# Patient Record
Sex: Male | Born: 1950 | Race: White | Hispanic: No | Marital: Married | Smoking: Former smoker
Health system: Southern US, Community
[De-identification: ages and names within clinical notes are randomized; demographics above are authoritative.]

## PROBLEM LIST (undated history)

## (undated) DIAGNOSIS — I714 Abdominal aortic aneurysm, without rupture, unspecified: Secondary | ICD-10-CM

## (undated) DIAGNOSIS — M1990 Unspecified osteoarthritis, unspecified site: Secondary | ICD-10-CM

## (undated) DIAGNOSIS — M2550 Pain in unspecified joint: Secondary | ICD-10-CM

## (undated) DIAGNOSIS — I10 Essential (primary) hypertension: Secondary | ICD-10-CM

## (undated) DIAGNOSIS — I480 Paroxysmal atrial fibrillation: Secondary | ICD-10-CM

## (undated) DIAGNOSIS — E785 Hyperlipidemia, unspecified: Secondary | ICD-10-CM

## (undated) HISTORY — DX: Paroxysmal atrial fibrillation: I48.0

## (undated) HISTORY — PX: OTHER SURGICAL HISTORY: SHX169

## (undated) HISTORY — DX: Abdominal aortic aneurysm, without rupture, unspecified: I71.40

## (undated) HISTORY — DX: Unspecified osteoarthritis, unspecified site: M19.90

## (undated) HISTORY — DX: Abdominal aortic aneurysm, without rupture: I71.4

## (undated) HISTORY — DX: Hyperlipidemia, unspecified: E78.5

## (undated) HISTORY — DX: Pain in unspecified joint: M25.50

---
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
## 1993-04-13 DIAGNOSIS — E785 Hyperlipidemia, unspecified: Secondary | ICD-10-CM

## 1993-04-13 HISTORY — DX: Hyperlipidemia, unspecified: E78.5

## 1998-07-03 ENCOUNTER — Encounter: Payer: Self-pay | Admitting: Cardiology

## 1998-07-03 ENCOUNTER — Inpatient Hospital Stay (HOSPITAL_COMMUNITY): Admission: EM | Admit: 1998-07-03 | Discharge: 1998-07-06 | Payer: Self-pay | Admitting: Emergency Medicine

## 2000-04-13 HISTORY — PX: OTHER SURGICAL HISTORY: SHX169

## 2001-07-27 ENCOUNTER — Encounter: Payer: Self-pay | Admitting: Orthopedic Surgery

## 2001-07-27 ENCOUNTER — Encounter: Admission: RE | Admit: 2001-07-27 | Discharge: 2001-07-27 | Payer: Self-pay | Admitting: Orthopedic Surgery

## 2001-07-28 ENCOUNTER — Ambulatory Visit (HOSPITAL_BASED_OUTPATIENT_CLINIC_OR_DEPARTMENT_OTHER): Admission: RE | Admit: 2001-07-28 | Discharge: 2001-07-28 | Payer: Self-pay | Admitting: Orthopedic Surgery

## 2008-08-21 ENCOUNTER — Encounter: Admission: RE | Admit: 2008-08-21 | Discharge: 2008-08-21 | Payer: Self-pay | Admitting: Cardiology

## 2009-08-14 ENCOUNTER — Encounter: Admission: RE | Admit: 2009-08-14 | Discharge: 2009-08-14 | Payer: Self-pay | Admitting: Cardiology

## 2010-02-11 ENCOUNTER — Ambulatory Visit: Payer: Self-pay | Admitting: Cardiology

## 2010-07-20 ENCOUNTER — Other Ambulatory Visit: Payer: Self-pay | Admitting: Cardiology

## 2010-07-20 DIAGNOSIS — E785 Hyperlipidemia, unspecified: Secondary | ICD-10-CM

## 2010-08-04 ENCOUNTER — Other Ambulatory Visit: Payer: Self-pay | Admitting: *Deleted

## 2010-08-04 DIAGNOSIS — E7800 Pure hypercholesterolemia, unspecified: Secondary | ICD-10-CM

## 2010-08-07 ENCOUNTER — Encounter: Payer: Self-pay | Admitting: Cardiology

## 2010-08-07 DIAGNOSIS — I10 Essential (primary) hypertension: Secondary | ICD-10-CM | POA: Insufficient documentation

## 2010-08-07 DIAGNOSIS — G43909 Migraine, unspecified, not intractable, without status migrainosus: Secondary | ICD-10-CM | POA: Insufficient documentation

## 2010-08-07 DIAGNOSIS — M2550 Pain in unspecified joint: Secondary | ICD-10-CM | POA: Insufficient documentation

## 2010-08-07 DIAGNOSIS — I480 Paroxysmal atrial fibrillation: Secondary | ICD-10-CM | POA: Insufficient documentation

## 2010-08-07 DIAGNOSIS — E785 Hyperlipidemia, unspecified: Secondary | ICD-10-CM | POA: Insufficient documentation

## 2010-08-07 DIAGNOSIS — I714 Abdominal aortic aneurysm, without rupture: Secondary | ICD-10-CM | POA: Insufficient documentation

## 2010-08-14 ENCOUNTER — Encounter: Payer: Self-pay | Admitting: Cardiology

## 2010-08-14 ENCOUNTER — Other Ambulatory Visit (INDEPENDENT_AMBULATORY_CARE_PROVIDER_SITE_OTHER): Payer: BC Managed Care – PPO | Admitting: *Deleted

## 2010-08-14 ENCOUNTER — Ambulatory Visit (INDEPENDENT_AMBULATORY_CARE_PROVIDER_SITE_OTHER): Payer: BC Managed Care – PPO | Admitting: Cardiology

## 2010-08-14 VITALS — BP 118/82 | HR 70 | Ht >= 80 in | Wt 250.0 lb

## 2010-08-14 DIAGNOSIS — I119 Hypertensive heart disease without heart failure: Secondary | ICD-10-CM

## 2010-08-14 DIAGNOSIS — E7800 Pure hypercholesterolemia, unspecified: Secondary | ICD-10-CM

## 2010-08-14 DIAGNOSIS — E785 Hyperlipidemia, unspecified: Secondary | ICD-10-CM

## 2010-08-14 DIAGNOSIS — Z125 Encounter for screening for malignant neoplasm of prostate: Secondary | ICD-10-CM

## 2010-08-14 DIAGNOSIS — I480 Paroxysmal atrial fibrillation: Secondary | ICD-10-CM

## 2010-08-14 DIAGNOSIS — I714 Abdominal aortic aneurysm, without rupture: Secondary | ICD-10-CM

## 2010-08-14 DIAGNOSIS — I4891 Unspecified atrial fibrillation: Secondary | ICD-10-CM

## 2010-08-14 LAB — HEPATIC FUNCTION PANEL
ALT: 36 U/L (ref 0–53)
AST: 40 U/L — ABNORMAL HIGH (ref 0–37)
Alkaline Phosphatase: 61 U/L (ref 39–117)
Bilirubin, Direct: 0.2 mg/dL (ref 0.0–0.3)
Total Bilirubin: 1 mg/dL (ref 0.3–1.2)
Total Protein: 7.3 g/dL (ref 6.0–8.3)

## 2010-08-14 LAB — LIPID PANEL
Total CHOL/HDL Ratio: 3
Triglycerides: 259 mg/dL — ABNORMAL HIGH (ref 0.0–149.0)

## 2010-08-14 LAB — LDL CHOLESTEROL, DIRECT: Direct LDL: 88.8 mg/dL

## 2010-08-14 LAB — PSA: PSA: 1.79 ng/mL (ref 0.10–4.00)

## 2010-08-14 LAB — BASIC METABOLIC PANEL
BUN: 10 mg/dL (ref 6–23)
Calcium: 9.5 mg/dL (ref 8.4–10.5)
GFR: 110.68 mL/min (ref >60.00)
Glucose, Bld: 99 mg/dL (ref 70–99)

## 2010-08-14 NOTE — Progress Notes (Signed)
Subjective:   Rodney Hardy is seen today for followup visit. He has a history of paroxysmal atrial fibrillation, and hypertensive heart disease. He has an early abdominal aortic aneurysm that we have been following annually. He's had hyperlipemia since 1995. He does have history of migraines. In general, he's continued to do well. We'll check lab work today and arrange for a followup abdominal ultrasound. He does see Dr. Posey Rea her followup primary care but doesn't see him frequently.  Current Outpatient Prescriptions  Medication Sig Dispense Refill  . ALPRAZolam (XANAX) 0.5 MG tablet Take 0.5 mg by mouth at bedtime as needed.        . Ascorbic Acid (VITAMIN C) 500 MG tablet Take 500 mg by mouth daily.        Marland Kitchen aspirin 81 MG tablet Take 81 mg by mouth daily.        Marland Kitchen atenolol (TENORMIN) 50 MG tablet Take 50 mg by mouth daily.        . Cholecalciferol (VITAMIN D) 1000 UNITS capsule Take 1,000 Units by mouth daily.        . digoxin (LANOXIN) 0.25 MG tablet Take 250 mcg by mouth daily.        Marland Kitchen losartan-hydrochlorothiazide (HYZAAR) 100-12.5 MG per tablet Take 1 tablet by mouth daily.        . Multiple Vitamin (MULTIVITAMIN) tablet Take 1 tablet by mouth daily.        . simvastatin (ZOCOR) 40 MG tablet Take 40 mg by mouth at bedtime.        . vitamin E 400 UNIT capsule Take 400 Units by mouth daily.        Marland Kitchen ZETIA 10 MG tablet take 1 tablet by mouth once daily  30 tablet  6    Allergies  Allergen Reactions  . Erythromycin   . Lisinopril Cough  . Novocain     Patient Active Problem List  Diagnoses  . Hyperlipidemia  . PAF (paroxysmal atrial fibrillation)  . Hypertensive heart disease  . Abdominal aortic aneurysm  . Migraine  . Joint pain    History  Smoking status  . Former Smoker -- 2.0 packs/day for 20 years  . Types: Cigarettes  . Quit date: 04/13/1969  Smokeless tobacco  . Never Used    History  Alcohol Use No    Family History  Problem Relation Age of Onset  . Lymphoma  Father 1  . Arrhythmia Sister     Review of Systems:   The patient denies any heat or cold intolerance.  No weight gain or weight loss.  The patient denies headaches or blurry vision.  There is no cough or sputum production.  The patient denies dizziness.  There is no hematuria or hematochezia.  The patient denies any muscle aches or arthritis.  The patient denies any rash.  The patient denies frequent falling or instability.  There is no history of depression or anxiety.  All other systems were reviewed and are negative.   Physical Exam:   Weight is 250. Vital signs are reviewed.The head is normocephalic and atraumatic.  Pupils are equally round and reactive to light.  Sclerae nonicteric.  Conjunctiva is clear.  Oropharynx is unremarkable.  There's adequate oral airway.  Neck is supple there are no masses.  Thyroid is not enlarged.  There is no lymphadenopathy.  Lungs are clear.  Chest is symmetric.  Heart shows a regular rate and rhythm.  S1 and S2 are normal.  There is no murmur click  or gallop.  Abdomen is soft normal bowel sounds.  There is no organomegaly.  Genital and rectal deferred.  Extremities are without edema.  Peripheral pulses are adequate.  Neurologically intact.  Full range of motion.  The patient is not depressed.  Skin is warm and dry.  Assessment / Plan:

## 2010-08-15 ENCOUNTER — Telehealth: Payer: Self-pay | Admitting: *Deleted

## 2010-08-15 NOTE — Assessment & Plan Note (Signed)
Blood pressures been well-controlled and he is tolerating medications.

## 2010-08-15 NOTE — Assessment & Plan Note (Signed)
I'll encourage him to continue meds as well as to exercise and lose weight.

## 2010-08-15 NOTE — Assessment & Plan Note (Signed)
Abdominal ultrasound will be ordered.

## 2010-08-15 NOTE — Telephone Encounter (Signed)
Labs reported 

## 2010-08-15 NOTE — Assessment & Plan Note (Signed)
He has not had recurrence of his paroxysmal atrial fibrillation.

## 2010-08-22 ENCOUNTER — Encounter (INDEPENDENT_AMBULATORY_CARE_PROVIDER_SITE_OTHER): Payer: BC Managed Care – PPO | Admitting: Cardiology

## 2010-08-22 DIAGNOSIS — I714 Abdominal aortic aneurysm, without rupture, unspecified: Secondary | ICD-10-CM

## 2010-08-25 ENCOUNTER — Encounter: Payer: Self-pay | Admitting: Cardiology

## 2010-08-28 ENCOUNTER — Telehealth: Payer: Self-pay | Admitting: Cardiology

## 2010-08-28 NOTE — Telephone Encounter (Signed)
Pt informed of Abdominal Aortic Aneurysm [place] results and need for recheck in 1 year. Pt verbalizes understanding. Pt also aware of follow-up with Dr. Elease Hashimoto in 6 months

## 2010-08-29 NOTE — Op Note (Signed)
[facility] [place]  Patient:    Rodney Hardy, Rodney Hardy Visit Number: [id number] MRN: [id number]          Service Type: DSU Location: [facility] Attending Physician:  Colbert Ewing Dictated by:   Loreta Ave, M.D. Proc. Date: 07/28/01 Admit Date:  07/28/2001 Discharge Date: 07/28/2001                             Operative Report  PREOPERATIVE DIAGNOSIS:  Symptomatic loose bodies, left elbow, with degenerative changes, lateral compartment on the capitellum.  POSTOPERATIVE DIAGNOSES: 1. Symptomatic loose bodies, left elbow, with degenerative changes, lateral    compartment on the capitellum, also with relatively large extra-articular,    intracapsular loose body laterally. 2. Grade 4 changes, capitellum.  PROCEDURE: 1. Left elbow examination under anesthesia, arthroscopy, with chondroplasty    lateral compartment. 2. Removal of intra-articular loose bodies. 3. Lateral arthrotomy with removal of intracapsular osteochondral fragments.  SURGEON:  Loreta Ave, M.D.  ASSISTANT:  Arlys John D. Petrarca, P.A.-C.  ANESTHESIA:  General.  ESTIMATED BLOOD LOSS:  Minimal.  TOURNIQUET TIME:  1 hour 10 minutes.  SPECIMENS:  None.  CULTURES:  None.  COMPLICATIONS:  None.  DRESSING:  Soft compressive.  DESCRIPTION OF PROCEDURE:  Patient brought to the operating room and placed on the operating table in supine position.  After adequate anesthesia had been obtained, left elbow examined.  Crepitus laterally but full motion and good stability.  Tourniquet applied, prepped and draped in the usual sterile fashion.  Distraction for arthroscopy utilized.  Anterolateral and anteromedial portals created as well as two posterior portals, one each medial and lateral.  Neurovascular structures identified and protected throughout. The arthroscope was introduced anteriorly.  The anterior aspect of the elbow examined.  Synovitis and small loose bodies removed.   Most of the anterior aspect of the elbow looked good.  The entire medial compartment looked good. Lateral compartment revealed some grade 2 and changes on the radial head. Unfortunately, grade 4 changes over much of the capitellum.  Utilizing all four portals, all recesses examined and all loose bodies removed.  The largest loose body seen on preop study was not appreciated within the elbow itself. Fluoroscopy was therefore utilized to localize this on the lateral side of the elbow where it had not been seen intra-articularly.  Instruments and fluid had been removed.  A small incision was made at the radiocapitellar interval slightly posterior, where the other loose body was localized by fluoroscopy. This was approached with a capsular-splitting incision, and the loose body was found to be tethered within the capsule itself.  Removed in its entirety. Confirmed with fluoroscopy.  All wounds irrigated.  Portals closed with nylon. The incision closed with subcutaneous and subcuticular Vicryl.  Margins of the wound injected with Marcaine.  Sterile compressive dressing applied.  The tourniquet, which had been inflated at the start of the case, was deflated and then removed.  Anesthesia reversed.  Brought to the recovery room.  He tolerated the surgery well with no complications. Dictated by:   Loreta Ave, M.D. Attending Physician:  Colbert Ewing DD:  07/28/01 TD:  07/30/01 Job: [id number]

## 2010-10-17 ENCOUNTER — Other Ambulatory Visit: Payer: Self-pay | Admitting: *Deleted

## 2010-10-17 MED ORDER — SIMVASTATIN 40 MG PO TABS: 40.0000 mg | ORAL_TABLET | Freq: Every day | ORAL | Status: DC

## 2010-10-17 NOTE — Telephone Encounter (Signed)
escribe medication per fax request  

## 2011-01-11 ENCOUNTER — Other Ambulatory Visit: Payer: Self-pay | Admitting: Cardiology

## 2011-01-12 NOTE — Telephone Encounter (Signed)
Refilled Meds from fax  

## 2011-01-26 ENCOUNTER — Telehealth: Payer: Self-pay | Admitting: Cardiovascular Disease

## 2011-01-26 NOTE — Telephone Encounter (Signed)
Pt is returning your call

## 2011-01-26 NOTE — Telephone Encounter (Signed)
Pt says he is returning Octavia's call from last Friday.  He did not know why she was calling and I did not find record of call. I reviewed his medications and allergies with pt. He also asked about fasting lab work that Dr. Deborah Chalk does yearly. He will be fasting when he comes in Wednesday for his 8:30am appt with Dr. Elease Hashimoto. Mylo Red RN

## 2011-01-28 ENCOUNTER — Encounter: Payer: Self-pay | Admitting: Cardiovascular Disease

## 2011-01-28 ENCOUNTER — Ambulatory Visit (INDEPENDENT_AMBULATORY_CARE_PROVIDER_SITE_OTHER): Payer: BC Managed Care – PPO | Admitting: Cardiovascular Disease

## 2011-01-28 DIAGNOSIS — E785 Hyperlipidemia, unspecified: Secondary | ICD-10-CM

## 2011-01-28 DIAGNOSIS — I714 Abdominal aortic aneurysm, without rupture: Secondary | ICD-10-CM

## 2011-01-28 DIAGNOSIS — I119 Hypertensive heart disease without heart failure: Secondary | ICD-10-CM

## 2011-01-28 LAB — LIPID PANEL
LDL Cholesterol: 72 mg/dL (ref 0–99)
Total CHOL/HDL Ratio: 3
Triglycerides: 173 mg/dL — ABNORMAL HIGH (ref 0.0–149.0)

## 2011-01-28 LAB — HEPATIC FUNCTION PANEL
AST: 35 U/L (ref 0–37)
Albumin: 4.4 g/dL (ref 3.5–5.2)
Alkaline Phosphatase: 60 U/L (ref 39–117)
Total Bilirubin: 1.1 mg/dL (ref 0.3–1.2)

## 2011-01-28 LAB — BASIC METABOLIC PANEL
CO2: 25 mEq/L (ref 19–32)
Calcium: 9.3 mg/dL (ref 8.4–10.5)
Chloride: 100 mEq/L (ref 96–112)
Creatinine, Ser: 0.8 mg/dL (ref 0.4–1.5)
Glucose, Bld: 104 mg/dL — ABNORMAL HIGH (ref 70–99)

## 2011-01-28 MED ORDER — CARVEDILOL 25 MG PO TABS: 25.0000 mg | ORAL_TABLET | Freq: Two times a day (BID) | ORAL | Status: DC

## 2011-01-28 MED ORDER — ATORVASTATIN CALCIUM 20 MG PO TABS: 20.0000 mg | ORAL_TABLET | Freq: Every day | ORAL | Status: DC

## 2011-01-28 NOTE — Assessment & Plan Note (Signed)
Stable

## 2011-01-28 NOTE — Progress Notes (Signed)
Rodney Hardy Date of Birth  04/14/1950 [facility] [address]    [address] [place], [place]  [address] [phone]  Fax  [phone]  History of Present Illness:  60 YO with a history of PAF, HTN, hyperlipidemia. 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 He has not had any significant episodes of chest pain, shortness breath, syncope, or presyncope. He's not had any recent episodes of atrial fibrillation. He has occasional palpitations which clinically sounds like premature ventricular contractions.  [place]ar[place] half miles a day with his dog.   Current Outpatient Prescriptions on File Prior to Visit  Medication Sig Dispense Refill  . ALPRAZolam (XANAX) 0.5 MG tablet Take 0.5 mg by mouth at bedtime as needed.        . Ascorbic Acid (VITAMIN C) 500 MG tablet Take 500 mg by mouth daily.        Marland Kitchen aspirin 81 MG tablet Take 81 mg by mouth daily.        Marland Kitchen atenolol (TENORMIN) 50 MG tablet take 1 tablet by mouth once daily  30 tablet  4  . Cholecalciferol (VITAMIN D) 1000 UNITS capsule Take 1,000 Units by mouth daily.        . digoxin (LANOXIN) 0.25 MG tablet Take 250 mcg by mouth daily.        Marland Kitchen losartan-hydrochlorothiazide (HYZAAR) 100-12.5 MG per tablet Take 1 tablet by mouth daily.        . Multiple Vitamin (MULTIVITAMIN) tablet Take 1 tablet by mouth daily.        . simvastatin (ZOCOR) 40 MG tablet Take 1 tablet (40 mg total) by mouth at bedtime.  90 tablet  2  . vitamin E 400 UNIT capsule Take 400 Units by mouth daily.        Marland Kitchen ZETIA 10 MG tablet take 1 tablet by mouth once daily  30 tablet  6    Allergies  Allergen Reactions  . Erythromycin   . Lisinopril Cough  . Novocain     Past Medical History  Diagnosis Date  . Hyperlipidemia 1995  . PAF (paroxysmal atrial fibrillation)   . Hypertensive heart disease   . Abdominal aortic aneurysm   . Migraine   . Joint pain     Past Surgical History  Procedure Date  . Cardiac catheterization    Normal coronary arteries  . Other surgical history     Bone Spur tooken off left elbow    History  Smoking status  . Former Smoker -- 2.0 packs/day for 20 years  . Types: Cigarettes  . Quit date: 04/13/1969  Smokeless tobacco  . Never Used    History  Alcohol Use No    Family History  Problem Relation Age of Onset  . Lymphoma Father 47  . Arrhythmia Sister     Reviw of Systems:  Reviewed in the HPI.  All other systems are negative.  Physical Exam: BP 137/90  Pulse 68  Ht 6\' 8"  (2.032 m)  Wt 251 lb (113.853 kg)  BMI 27.57 kg/m2 The patient is alert and oriented x 3.  The mood and affect are normal.   Skin: warm and dry.  Color is normal.    HEENT:   the sclera are nonicteric.  The mucous membranes are moist.  The carotids are 2+ without bruits.  There is no thyromegaly.  There is no JVD.    Lungs: clear.  The chest wall is non tender.    Heart: regular rate with  a normal S1 and S2.  There are no murmurs, gallops, or rubs. The PMI is not displaced.     Abdomen: good bowel sounds.  There is no guarding or rebound.  There is no hepatosplenomegaly or tenderness.  There are no masses.   Extremities:  no clubbing, cyanosis, or edema.  The legs are without rashes.  The distal pulses are intact.   Neuro:  Cranial nerves II - XII are intact.  Motor and sensory functions are intact.    The gait is normal.  ECG: Normal sinus rhythm, normal EKG.   Assessment / Plan:

## 2011-01-28 NOTE — Assessment & Plan Note (Addendum)
We'll check a fasting lipid profile today. I'll buy to discontinue his simvastatin start him on Lipitor 20 mg a day. We'll check a lipid profile in 3 months.   We'll check it again when I see him again in one year.

## 2011-01-28 NOTE — Patient Instructions (Signed)
Your physician wants you to follow-up in: 1 year, You will receive a reminder letter in the mail two months in advance. If you don't receive a letter, please call our office to schedule the follow-up appointment.  Your physician recommends that you have labs today and return in 3 months/ plus at your yearly visit for fasting labs again due to medication switch.  Your physician has recommended you make the following change in your medication:  1) stop atenolol  2) stop simvastatin  3) start carvedilol 25 mg one tab two times a day- 12 hrs apart.  4) start Lipitor 20 mg one tablet each evening.

## 2011-01-28 NOTE — Assessment & Plan Note (Signed)
His BP is mildly elevated today.  He has noted some BP elevation.  We will change his atenolol to carvedilol 25 mg twice a day. He should offer some better blood pressure control.

## 2011-02-10 ENCOUNTER — Other Ambulatory Visit: Payer: Self-pay | Admitting: Cardiology

## 2011-03-13 ENCOUNTER — Telehealth: Payer: Self-pay | Admitting: Cardiovascular Disease

## 2011-03-13 NOTE — Telephone Encounter (Signed)
New problem Pt had some questions about the meds and dosage he is supposed to be taking. Please call

## 2011-03-13 NOTE — Telephone Encounter (Signed)
Pt was having his bp drop from sitting to standing and feeling lightheaded. Medications and last ov reviewed and pt had continued to take atenolol. Pt now aware to stop that med. Told to stay hydrated and continue to check bp and call with further questions or concerns.

## 2011-03-16 ENCOUNTER — Telehealth: Payer: Self-pay | Admitting: Cardiovascular Disease

## 2011-03-16 NOTE — Telephone Encounter (Signed)
Discussed pt issues with lori gerhardt np, pt to continue on the same med plan as before. He is to cut out all alcohol. He will call if does not settle down by the end of the week Deliah Goody

## 2011-03-16 NOTE — Telephone Encounter (Addendum)
Fu call He is calling back about atenolol He still is having afib and wants to talk to a nurse again

## 2011-03-16 NOTE — Telephone Encounter (Signed)
Pt talked with nurse last week, was lightheaded and dizzy, told to stop atenilol, on Sat he had five episodes of a-fib, took atenilol Sat night with 1 dose of carvediolol, which he is only taking at night now, pls advise, he is going out of town [address]

## 2011-03-16 NOTE — Telephone Encounter (Signed)
Spoke with pt, he states he has had two episodes of atrial fib in the last 30 min. Not sure now if related to the med change. He denies anything else going on, he does drink occ beer. Will discuss with lori gerhardt np.

## 2011-03-16 NOTE — Telephone Encounter (Signed)
Spoke with pt, his atenolol was stopped last week and sat he had 5 episodes of atrial fib that lasted about 2-3 min. Since sat he has taken atenolol 50 mg in the am and carvedilol 25 mg in the pm. He has not had anyother a fib and wonders what to do next . Will discuss with lori gerhardt np Deliah Goody

## 2011-03-16 NOTE — Telephone Encounter (Signed)
Discussed with lori gerhardt np, pt instructed to take 1/2 dosage of atenolol in the am and carvedilol 25 mg in the pm for the next three days. After that time he will take carvedilol 25 mg bid. He will call with cont problems Deliah Goody

## 2011-04-14 HISTORY — PX: CARDIAC CATHETERIZATION: SHX172

## 2011-04-30 ENCOUNTER — Telehealth: Payer: Self-pay | Admitting: *Deleted

## 2011-04-30 ENCOUNTER — Other Ambulatory Visit (INDEPENDENT_AMBULATORY_CARE_PROVIDER_SITE_OTHER): Payer: BC Managed Care – PPO | Admitting: *Deleted

## 2011-04-30 DIAGNOSIS — I714 Abdominal aortic aneurysm, without rupture: Secondary | ICD-10-CM

## 2011-04-30 DIAGNOSIS — I119 Hypertensive heart disease without heart failure: Secondary | ICD-10-CM

## 2011-04-30 DIAGNOSIS — E785 Hyperlipidemia, unspecified: Secondary | ICD-10-CM

## 2011-04-30 LAB — LIPID PANEL
HDL: 50.8 mg/dL (ref >39.00)
Triglycerides: 237 mg/dL — ABNORMAL HIGH (ref 0.0–149.0)
VLDL: 47.4 mg/dL — ABNORMAL HIGH (ref 0.0–40.0)

## 2011-04-30 LAB — LDL CHOLESTEROL, DIRECT: Direct LDL: 68 mg/dL

## 2011-04-30 LAB — BASIC METABOLIC PANEL
BUN: 10 mg/dL (ref 6–23)
Calcium: 9.1 mg/dL (ref 8.4–10.5)
GFR: 112.13 mL/min (ref >60.00)
Potassium: 3.8 mEq/L (ref 3.5–5.1)
Sodium: 133 mEq/L — ABNORMAL LOW (ref 135–145)

## 2011-04-30 LAB — HEPATIC FUNCTION PANEL
Alkaline Phosphatase: 63 U/L (ref 39–117)
Bilirubin, Direct: 0.1 mg/dL (ref 0.0–0.3)

## 2011-04-30 NOTE — Telephone Encounter (Signed)
Pt in for labs today med list reviewed/updated.

## 2011-05-04 ENCOUNTER — Other Ambulatory Visit: Payer: Self-pay | Admitting: *Deleted

## 2011-05-04 MED ORDER — LOSARTAN POTASSIUM-HCTZ 100-12.5 MG PO TABS: 1.0000 | ORAL_TABLET | Freq: Every day | ORAL | Status: DC

## 2011-07-29 ENCOUNTER — Encounter (HOSPITAL_COMMUNITY): Payer: Self-pay | Admitting: *Deleted

## 2011-07-29 ENCOUNTER — Telehealth: Payer: Self-pay | Admitting: Cardiovascular Disease

## 2011-07-29 ENCOUNTER — Emergency Department (HOSPITAL_COMMUNITY)
Admission: EM | Admit: 2011-07-29 | Discharge: 2011-07-31 | Disposition: A | Discharge status: Home / Self Care | Payer: BC Managed Care – PPO | Attending: Emergency Medicine | Admitting: Emergency Medicine

## 2011-07-29 DIAGNOSIS — I4891 Unspecified atrial fibrillation: Secondary | ICD-10-CM | POA: Insufficient documentation

## 2011-07-29 HISTORY — DX: Essential (primary) hypertension: I10

## 2011-07-29 LAB — BASIC METABOLIC PANEL
BUN: 13 mg/dL (ref 6–23)
Chloride: 98 mEq/L (ref 96–112)
Creatinine, Ser: 0.84 mg/dL (ref 0.50–1.35)
GFR calc Af Amer: >90 mL/min (ref >90)
GFR calc non Af Amer: >90 mL/min (ref >90)

## 2011-07-29 NOTE — Telephone Encounter (Signed)
Pt went to ER today for a-fib, had episode yesterday as well, both episodes lasted about an hour, where they are usually just a few minutes in length, requesting dr Elease Hashimoto review hospital records/test/blood work to see if he needs to see him or change any meds, pls call

## 2011-07-29 NOTE — ED Notes (Signed)
Pt states that he went into atrial fibrillation while standing in [employer] today.  Pt went home and took and extra Carvedilol and Xanax and then felt himself convert.  Pt sts it happened yesterday. Pt sts with afib he feels tightness and discomfort.  Pt is only on asa for blood thinner

## 2011-07-30 ENCOUNTER — Telehealth: Payer: Self-pay | Admitting: *Deleted

## 2011-07-30 DIAGNOSIS — R002 Palpitations: Secondary | ICD-10-CM

## 2011-07-30 MED ORDER — ASPIRIN 325 MG PO TABS: 325.0000 mg | ORAL_TABLET | Freq: Every day | ORAL | Status: AC

## 2011-07-30 NOTE — Telephone Encounter (Signed)
SEE NOTE

## 2011-07-30 NOTE — Telephone Encounter (Signed)
PT WAS CALLED AND DR NAHSER WANTED AN EVENT MONITOR, PT INFORMED AND TOLD HE WILL GET A CALL TOMORROW. 6 MO OV SET, AND DR NAHSER WANTED HIM TO TAKE ASA 325 MG

## 2011-08-03 ENCOUNTER — Encounter (INDEPENDENT_AMBULATORY_CARE_PROVIDER_SITE_OTHER): Payer: BC Managed Care – PPO

## 2011-08-03 DIAGNOSIS — I4891 Unspecified atrial fibrillation: Secondary | ICD-10-CM

## 2011-08-04 NOTE — Telephone Encounter (Signed)
Patient had monitor put on 4/'22/13 per Windell Moulding.

## 2011-08-10 ENCOUNTER — Telehealth: Payer: Self-pay | Admitting: Cardiovascular Disease

## 2011-08-10 NOTE — Telephone Encounter (Signed)
Please return call to [facility] Cardiac Tech   [facility] [phone]   Patient had abnormal EKG today

## 2011-08-10 NOTE — Telephone Encounter (Signed)
Spoke with pt, states usually palpitations last less than 5 minutes, when he feels dizzy and heart going fast, he sits down and usually feeling goes away soon. Received [facility] cardiac recordings from today, showed dr allred 08/10/11 1;12 pm results, pt is taking carvedilol as prescribed and currently feels fine/ pt at work. Dr Johney Frame stated if feeling ok give results to Dr Elease Hashimoto , Dr Elease Hashimoto was called, Dr Elease Hashimoto at [facility] currently but informed by VM that I have an ekg for him to see today or in morning.

## 2011-08-11 ENCOUNTER — Telehealth: Payer: Self-pay | Admitting: *Deleted

## 2011-08-11 DIAGNOSIS — I472 Ventricular tachycardia: Secondary | ICD-10-CM

## 2011-08-11 NOTE — Telephone Encounter (Signed)
Pt informed I will call him in am to give him and echo time and then will be fit into Dr Namon Cirri schedule, pt agreed to plan.

## 2011-08-12 ENCOUNTER — Encounter: Payer: Self-pay | Admitting: Cardiovascular Disease

## 2011-08-12 ENCOUNTER — Other Ambulatory Visit: Payer: Self-pay

## 2011-08-12 ENCOUNTER — Ambulatory Visit (HOSPITAL_COMMUNITY): Payer: BC Managed Care – PPO | Attending: Cardiology

## 2011-08-12 ENCOUNTER — Ambulatory Visit (INDEPENDENT_AMBULATORY_CARE_PROVIDER_SITE_OTHER): Payer: BC Managed Care – PPO | Admitting: Cardiovascular Disease

## 2011-08-12 ENCOUNTER — Ambulatory Visit: Payer: BC Managed Care – PPO | Admitting: Cardiovascular Disease

## 2011-08-12 ENCOUNTER — Telehealth: Payer: Self-pay | Admitting: *Deleted

## 2011-08-12 VITALS — BP 114/74 | HR 60 | Ht >= 80 in | Wt 252.0 lb

## 2011-08-12 DIAGNOSIS — Z87891 Personal history of nicotine dependence: Secondary | ICD-10-CM | POA: Insufficient documentation

## 2011-08-12 DIAGNOSIS — R000 Tachycardia, unspecified: Secondary | ICD-10-CM

## 2011-08-12 DIAGNOSIS — I4891 Unspecified atrial fibrillation: Secondary | ICD-10-CM | POA: Insufficient documentation

## 2011-08-12 DIAGNOSIS — I472 Ventricular tachycardia, unspecified: Secondary | ICD-10-CM

## 2011-08-12 MED ORDER — PROPRANOLOL HCL 10 MG PO TABS: 10.0000 mg | ORAL_TABLET | Freq: Four times a day (QID) | ORAL | Status: DC | PRN

## 2011-08-12 NOTE — Telephone Encounter (Signed)
Pt to be here at 3:30 for a fit in appointment for an echo/ asap. Pt agreed to plan. Pre cert sent to [facility] pt has bcbs.

## 2011-08-12 NOTE — Assessment & Plan Note (Signed)
Rodney Hardy presents for further evaluation of some episodes of tachycardia. These episodes do not appear to be atrial fibrillation. Am concerned that he may have ventricular tachycardia. His echocardiogram today reveals normal left ventricular systolic function. His ejection fraction is perhaps 50-55%. His wall thickness is normal.  There is a question about whether or not these episodes started when I changed him from atenolol to carvedilol. He seems to be adequately beta blocked and his resting heart rate is 60.  One possibility would be to put him back on atenolol but at this point I think that he is adequately beta blocked. I will give him a prescription for propranolol to take on an as-needed basis when he has a these episodes.  I would like to do a stress Myoview study.  We will refer him to Dr. Graciela Husbands for further evaluation. I'll see him again in 3 months.  WE will try to get a Digoxin level and potassium at his next visit.

## 2011-08-12 NOTE — Progress Notes (Signed)
Rodney Hardy Date of Birth  03/08/1951 [facility] [address]    [address] [place], [place]  [address] [phone]  Fax  [phone]   Problem List: 1. HTN 2. Ventricular Tachycardia   History of Present Illness:  61 YO with a history of PAF, HTN, hyperlipidemia.   He presented to the emergency room with some palpitations several weeks ago. We'll place an event monitor on him he was found to have sustained episodes of wide-complex tachycardia. Appears to be consistent with ventricular tachycardia. He can feel immediately when he goes into this dysrhythmia. He gets some dizziness and some shortness of breath but he's never had any episodes of syncope.    Current Outpatient Prescriptions on File Prior to Visit  Medication Sig Dispense Refill  . ALPRAZolam (XANAX) 0.5 MG tablet Take 0.5 mg by mouth as needed. For anxiety.      . Ascorbic Acid (VITAMIN C) 500 MG tablet Take 500 mg by mouth daily.        . aspirin 325 MG tablet Take 1 tablet (325 mg total) by mouth daily.      . atorvastatin (LIPITOR) 20 MG tablet Take 1 tablet (20 mg total) by mouth daily.  30 tablet  11  . carvedilol (COREG) 25 MG tablet Take 1 tablet (25 mg total) by mouth 2 (two) times daily.  60 tablet  11  . Cholecalciferol (VITAMIN D) 1000 UNITS capsule Take 1,000 Units by mouth daily.        . digoxin (LANOXIN) 0.25 MG tablet take 1 tablet by mouth once daily  30 tablet  6  . losartan-hydrochlorothiazide (HYZAAR) 100-12.5 MG per tablet Take 1 tablet by mouth daily.  90 tablet  3  . Multiple Vitamin (MULTIVITAMIN) tablet Take 1 tablet by mouth daily.        . vitamin E 400 UNIT capsule Take 400 Units by mouth daily.        . ZETIA 10 MG tablet take 1 tablet by mouth once daily  30 tablet  6    Allergies  Allergen Reactions  . Erythromycin Other (See Comments)    unknown  . Lisinopril Cough  . Procaine Hcl Other (See Comments)    Pt's heart races.    Past Medical History  Diagnosis  Date  . Hyperlipidemia 1995  . PAF (paroxysmal atrial fibrillation)   . Hypertensive heart disease   . Abdominal aortic aneurysm   . Migraine   . Joint pain   . Hypertension     Past Surgical History  Procedure Date  . Other surgical history     Bone Spur tooken off left elbow  . Cardiac catheterization     Normal coronary arteries    History  Smoking status  . Former Smoker -- 2.0 packs/day for 20 years  . Types: Cigarettes  . Quit date: 04/13/1969  Smokeless tobacco  . Never Used    History  Alcohol Use No    Family History  Problem Relation Age of Onset  . Lymphoma Father 66  . Arrhythmia Sister     Reviw of Systems:  Reviewed in the HPI.  All other systems are negative.  Physical Exam: BP 114/74  Pulse 60  Ht 6' 8" (2.032 m)  Wt 252 lb (114.306 kg)  BMI 27.68 kg/m2 The patient is alert and oriented x 3.  The mood and affect are normal.   Skin: warm and dry.  Color is normal.    HEENT:     the sclera are nonicteric.  The mucous membranes are moist.  The carotids are 2+ without bruits.  There is no thyromegaly.  There is no JVD.    Lungs: clear.  The chest wall is non tender.    Heart: regular rate with a normal S1 and S2.  There are no murmurs, gallops, or rubs. The PMI is not displaced.     Abdomen: good bowel sounds.  There is no guarding or rebound.  There is no hepatosplenomegaly or tenderness.  There are no masses.   Extremities:  no clubbing, cyanosis, or edema.  The legs are without rashes.  The distal pulses are intact.   Neuro:  Cranial nerves II - XII are intact.  Motor and sensory functions are intact.    The gait is normal.  ECG: Normal sinus rhythm, normal EKG.   Telemetry monitor reveals episodes of regular wide-complex tachycardia. These episodes are sustained. 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  I have reviewed these with Dr. Klein who agrees that they may be due to ventricular tachycardia.  Assessment / Plan:   

## 2011-08-12 NOTE — Patient Instructions (Addendum)
Your physician recommends that you schedule a follow-up appointment in: 3 months  You have been referred to Dr Graciela Husbands   Your physician has recommended you make the following change in your medication:   Start propranolol 10 mg tablet as needed for palpitations. Take one every thirty minutes up to four doses.    Your physician recommends that you return for lab work in: tomorrow dig level/ bmet

## 2011-08-13 ENCOUNTER — Ambulatory Visit: Payer: BC Managed Care – PPO | Admitting: *Deleted

## 2011-08-13 ENCOUNTER — Ambulatory Visit: Payer: BC Managed Care – PPO | Admitting: Cardiovascular Disease

## 2011-08-13 DIAGNOSIS — I10 Essential (primary) hypertension: Secondary | ICD-10-CM

## 2011-08-13 DIAGNOSIS — E785 Hyperlipidemia, unspecified: Secondary | ICD-10-CM

## 2011-08-13 DIAGNOSIS — I480 Paroxysmal atrial fibrillation: Secondary | ICD-10-CM

## 2011-08-16 ENCOUNTER — Telehealth: Payer: Self-pay | Admitting: Cardiology

## 2011-08-16 NOTE — Telephone Encounter (Signed)
Received a call from [employer] [facility] regarding Rodney Hardy. They reported a one minute episode of wide complex tachycardia of 170bpm that converted to NSR. I called the patient who reported around 5:25pm he was standing at his dresser after walking the dogs for about 0.5 miles when he felt lightheaded, dizzy, and had palpitations. It is similar to how he has felt in the past. He denies chest pain or syncope. He took his propranolol and sat down. The episode lasted about 10 minutes then he felt well. He denies any symptoms presently. He reported that he has a stress test on Tuesday and was told to hold his beta blocker starting tomorrow in preparation for the test. I told him that if these symptoms recur it is ok to take his propranolol and then call the office. I also told him that if these symptoms occur again and don't resolve with the propranolol or he has chest pain or a syncopal episode he needs to call 911 immediately. He stated understanding.  Rodney Lienhard PA-C 08/16/2011 6:19 PM

## 2011-08-17 ENCOUNTER — Telehealth: Payer: Self-pay | Admitting: Physician Assistant

## 2011-08-17 NOTE — Telephone Encounter (Addendum)
Received a call from [employer] regarding WCT - Pt had an episode yesterday and 1 today, each lasting about 5 minutes. He has not taken the Coreg today, in preparation for the stress test scheduled for tomorrow and was afraid to take the propanolol for the same reason.  He is very aware of the palpitations and gets symptomatic with them but is not having presyncope or syncope.   Spoke with Dr Myrtis Ser who reviewed the strips. He recommends the pt restart his Coreg and defer the stress test for now. Contacted the patient again who is agreeable to this. He will restart the Coreg and understands he is to take the propanolol if he gets more arrhythmia. Dr Myrtis Ser is to update Dr Elease Hashimoto.

## 2011-08-18 ENCOUNTER — Other Ambulatory Visit: Payer: Self-pay | Admitting: Cardiovascular Disease

## 2011-08-18 ENCOUNTER — Ambulatory Visit (HOSPITAL_COMMUNITY): Payer: BC Managed Care – PPO

## 2011-08-18 ENCOUNTER — Telehealth: Payer: Self-pay | Admitting: *Deleted

## 2011-08-18 ENCOUNTER — Other Ambulatory Visit (INDEPENDENT_AMBULATORY_CARE_PROVIDER_SITE_OTHER): Payer: BC Managed Care – PPO

## 2011-08-18 ENCOUNTER — Encounter: Payer: Self-pay | Admitting: *Deleted

## 2011-08-18 DIAGNOSIS — I472 Ventricular tachycardia: Secondary | ICD-10-CM

## 2011-08-18 DIAGNOSIS — R000 Tachycardia, unspecified: Secondary | ICD-10-CM

## 2011-08-18 DIAGNOSIS — I471 Supraventricular tachycardia: Secondary | ICD-10-CM

## 2011-08-18 DIAGNOSIS — I499 Cardiac arrhythmia, unspecified: Secondary | ICD-10-CM

## 2011-08-18 LAB — BASIC METABOLIC PANEL
BUN: 10 mg/dL (ref 6–23)
CO2: 27 mEq/L (ref 19–32)
Glucose, Bld: 131 mg/dL — ABNORMAL HIGH (ref 70–99)
Potassium: 3.6 mEq/L (ref 3.5–5.1)
Sodium: 137 mEq/L (ref 135–145)

## 2011-08-18 MED ORDER — ATENOLOL 50 MG PO TABS: 50.0000 mg | ORAL_TABLET | Freq: Every day | ORAL | Status: DC

## 2011-08-18 NOTE — H&P (Signed)
Rodney Hardy  Date of Birth 1950-10-29  [facility]  [address]  [place], [place] [address]    Problem List:  1. HTN  2. Ventricular Tachycardia   History of Present Illness:  61 YO with a history of PAF, HTN, hyperlipidemia.  He presented to the emergency room with some palpitations several weeks ago. We'll place an event monitor on him he was found to have sustained episodes of wide-complex tachycardia. Appears to be consistent with ventricular tachycardia. He can feel immediately when he goes into this dysrhythmia. He gets some dizziness and some shortness of breath but he's never had any episodes of syncope.   These episodes may have been worsened when we changed his atenolol to coreg.    He denies any syncope.  He denies any chest pain.  We attempted to do a stress myoview but he was having so much ventricular tachycardia that we were unable to do the study.  We have scheduled him for a cath tomorrow.  We changed his Coreg back to Atenolol.  We may try metoprolol.    Current Outpatient Prescriptions on File Prior to Visit   Medication  Sig  Dispense  Refill   .  ALPRAZolam (XANAX) 0.5 MG tablet  Take 0.5 mg by mouth as needed. For anxiety.     .  Ascorbic Acid (VITAMIN C) 500 MG tablet  Take 500 mg by mouth daily.     Marland Kitchen  aspirin 325 MG tablet  Take 1 tablet (325 mg total) by mouth daily.     Marland Kitchen  atorvastatin (LIPITOR) 20 MG tablet  Take 1 tablet (20 mg total) by mouth daily.  30 tablet  11   .  carvedilol (COREG) 25 MG tablet  Take 1 tablet (25 mg total) by mouth 2 (two) times daily.  60 tablet  11   .  Cholecalciferol (VITAMIN D) 1000 UNITS capsule  Take 1,000 Units by mouth daily.     .  digoxin (LANOXIN) 0.25 MG tablet  take 1 tablet by mouth once daily  30 tablet  6   .  losartan-hydrochlorothiazide (HYZAAR) 100-12.5 MG per tablet  Take 1 tablet by mouth daily.  90 tablet  3   .  Multiple Vitamin (MULTIVITAMIN) tablet  Take 1 tablet by mouth  daily.     .  vitamin E 400 UNIT capsule  Take 400 Units by mouth daily.     Marland Kitchen  ZETIA 10 MG tablet  take 1 tablet by mouth once daily  30 tablet  6    Allergies   Allergen  Reactions   .  Erythromycin  Other (See Comments)     unknown   .  Lisinopril  Cough   .  Procaine Hcl  Other (See Comments)     Pt's heart races.    Past Medical History   Diagnosis  Date   .  Hyperlipidemia  1995   .  PAF (paroxysmal atrial fibrillation)    .  Hypertensive heart disease    .  Abdominal aortic aneurysm    .  Migraine    .  Joint pain    .  Hypertension     Past Surgical History   Procedure  Date   .  Other surgical history      Bone Spur tooken off left elbow   .  Cardiac catheterization      Normal coronary arteries    History   Smoking status   .  Former Smoker -- 2.0 packs/day for 20 years   .  Types:  Cigarettes   .  Quit date:  04/13/1969   Smokeless tobacco   .  Never Used    History   Alcohol Use  No    Family History   Problem  Relation  Age of Onset   .  Lymphoma  Father  18   .  Arrhythmia  Sister     Reviw of Systems:  Reviewed in the HPI. All other systems are negative.  Physical Exam:  BP 114/74  Pulse 60  Ht 6\' 8"  (2.032 m)  Wt 252 lb (114.306 kg)  BMI 27.68 kg/m2  The patient is alert and oriented x 3. The mood and affect are normal.  Skin: warm and dry. Color is normal.  HEENT: the sclera are nonicteric. The mucous membranes are moist. The carotids are 2+ without bruits. There is no thyromegaly. There is no JVD.  Lungs: clear. The chest wall is non tender.  Heart: regular rate with a normal S1 and S2. There are no murmurs, gallops, or rubs. The PMI is not displaced.  Abdomen: good bowel sounds. There is no guarding or rebound. There is no hepatosplenomegaly or tenderness. There are no masses.  Extremities: no clubbing, cyanosis, or edema. The legs are without rashes. The distal pulses are intact.  Neuro: Cranial nerves II - XII are intact. Motor and  sensory functions are intact.  The gait is normal.  ECG:  Normal sinus rhythm, normal EKG.  Telemetry monitor reveals episodes of regular wide-complex tachycardia. These episodes are sustained. 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 I have reviewed these with Dr. Graciela Husbands who agrees that they may be due to ventricular tachycardia.  Assessment / Plan:   Tachycardia - Elyn Aquas., MD 08/12/2011 5:32 PM Signed  Rodney Hardy presents for further evaluation of some episodes of tachycardia. These episodes do not appear to be atrial fibrillation. Am concerned that he may have ventricular tachycardia. His echocardiogram today reveals normal left ventricular systolic function. His ejection fraction is perhaps 50-55%. His wall thickness is normal.  There is a question about whether or not these episodes started when I changed him from atenolol to carvedilol. He seems to be adequately beta blocked and his resting heart rate is 60. One possibility would be to put him back on atenolol but at this point I think that he is adequately beta blocked. I will give him a prescription for propranolol to take on an as-needed basis when he has a these episodes.   We attempted to do a stress myoview but he was having so much VT that the myoview was cancelled by Dr. Myrtis Ser.  We have scheduled him for a cath on 08/19/11.  If he is found to have significant CAD then we will need to proceed with revascularization.  If the cath is fairly normal, he should see EP.  We have discussed the risks/ benefits/ and options and he understands and agrees to proceed.

## 2011-08-18 NOTE — Telephone Encounter (Signed)
Pt will be set for [facility] tomorrow with Dr Kirke Corin in [facility] lab, see letter. Carvedilol was dc/ pt to re start atenolol tonight at 1/2 dose and then full tomorrow. Pt will come in today for labs and letter.

## 2011-08-19 ENCOUNTER — Inpatient Hospital Stay (HOSPITAL_BASED_OUTPATIENT_CLINIC_OR_DEPARTMENT_OTHER)
Admission: RE | Admit: 2011-08-19 | Discharge: 2011-08-19 | Disposition: A | Discharge status: Home / Self Care | Payer: BC Managed Care – PPO | Source: Ambulatory Visit | Attending: Cardiovascular Disease | Admitting: Cardiovascular Disease

## 2011-08-19 ENCOUNTER — Other Ambulatory Visit (HOSPITAL_COMMUNITY): Payer: BC Managed Care – PPO

## 2011-08-19 ENCOUNTER — Encounter (HOSPITAL_BASED_OUTPATIENT_CLINIC_OR_DEPARTMENT_OTHER)
Admission: RE | Disposition: A | Discharge status: Home / Self Care | Payer: Self-pay | Source: Ambulatory Visit | Attending: Cardiovascular Disease

## 2011-08-19 ENCOUNTER — Encounter (HOSPITAL_BASED_OUTPATIENT_CLINIC_OR_DEPARTMENT_OTHER): Payer: Self-pay | Admitting: *Deleted

## 2011-08-19 DIAGNOSIS — I472 Ventricular tachycardia, unspecified: Secondary | ICD-10-CM | POA: Insufficient documentation

## 2011-08-19 DIAGNOSIS — I4891 Unspecified atrial fibrillation: Secondary | ICD-10-CM | POA: Insufficient documentation

## 2011-08-19 DIAGNOSIS — I119 Hypertensive heart disease without heart failure: Secondary | ICD-10-CM | POA: Insufficient documentation

## 2011-08-19 DIAGNOSIS — E785 Hyperlipidemia, unspecified: Secondary | ICD-10-CM | POA: Insufficient documentation

## 2011-08-19 DIAGNOSIS — I4729 Other ventricular tachycardia: Secondary | ICD-10-CM | POA: Insufficient documentation

## 2011-08-19 LAB — POCT I-STAT 7, (LYTES, BLD GAS, ICA,H+H)
HCT: 41 % (ref 39.0–52.0)
Hemoglobin: 13.9 g/dL (ref 13.0–17.0)
Potassium: 3.6 mEq/L (ref 3.5–5.1)
Sodium: 138 mEq/L (ref 135–145)
pH, Arterial: 7.395 (ref 7.350–7.450)

## 2011-08-19 SURGERY — JV LEFT HEART CATHETERIZATION WITH CORONARY ANGIOGRAM
Anesthesia: Moderate Sedation

## 2011-08-19 MED ORDER — SODIUM CHLORIDE 0.9 % IV SOLN: INTRAVENOUS | Status: AC

## 2011-08-19 MED ORDER — ACETAMINOPHEN 325 MG PO TABS: 650.0000 mg | ORAL_TABLET | ORAL | Status: DC | PRN

## 2011-08-19 MED ORDER — DIAZEPAM 5 MG PO TABS
5.0000 mg | ORAL_TABLET | ORAL | Status: AC
  Administered 2011-08-19: 5 mg via ORAL

## 2011-08-19 MED ORDER — SODIUM CHLORIDE 0.9 % IJ SOLN: 3.0000 mL | Freq: Two times a day (BID) | INTRAMUSCULAR | Status: DC

## 2011-08-19 MED ORDER — SODIUM CHLORIDE 0.9 % IV SOLN
250.0000 mL | INTRAVENOUS | Status: DC | PRN
Start: 2011-08-19 — End: 2011-08-19

## 2011-08-19 MED ORDER — ONDANSETRON HCL 4 MG/2ML IJ SOLN: 4.0000 mg | Freq: Four times a day (QID) | INTRAMUSCULAR | Status: DC | PRN

## 2011-08-19 MED ORDER — ASPIRIN 81 MG PO CHEW: 324.0000 mg | CHEWABLE_TABLET | ORAL | Status: DC

## 2011-08-19 MED ORDER — SODIUM CHLORIDE 0.9 % IV SOLN
1.0000 mL/kg/h | INTRAVENOUS | Status: DC
  Administered 2011-08-19: 1 mL/kg/h via INTRAVENOUS

## 2011-08-19 MED ORDER — SODIUM CHLORIDE 0.9 % IJ SOLN: 3.0000 mL | INTRAMUSCULAR | Status: DC | PRN

## 2011-08-19 NOTE — Consult Note (Signed)
ELECTROPHYSIOLOGY CONSULT NOTE  Patient ID: Rodney Hardy MRN: [id number], DOB/AGE: Apr 13, 1951   Admit date: 08/19/2011 Date of Consult: 08/19/2011  Primary Physician: Sonda Primes, MD, MD Primary Cardiologist: Kristeen Miss, MD Reason for Consultation: Ventricular tachycardia  History of Present Illness Mr. Foskey is a pleasant 61 year old gentleman with a past medical history significant for PAF, HTN and dyslipidemia who has experienced recurrent tachypalpitations x 6 months. He reports being diagnosed with an "arrhythmia" over 10 years ago at which time he had a cardiac cath revealing normal coronary arteries. He was started on atenolol and discharged home. He denies any history of CHF, valvular heart disease, cardiomyopathy or congenital heart disase. He has done well on atenolol. However, six months ago he tells me his atenolol was switched to carvedilol and he feels this is what precipitated the increased frequency of his palpitations. He describes "racing" palpitations which start and end abruptly accompanied by chest tightness, shortness of breath and dizziness. These are occurring 3-4 times weekly and vary in duration. He denies syncope. He was seen as an outpatient for these symptoms at which time an echocardiogram was performed showing normal LV function, EF 50-55%. While wearing the monitor, he documented several episodes which were reviewed by Dr. Elease Hashimoto and felt to be most consistent with VT. He has now undergone LHC this morning which revealed no significant CAD. We have been asked to see him in consultation for EP recommendations.  Past Medical History  Diagnosis Date  . Hyperlipidemia 1995  . PAF (paroxysmal atrial fibrillation)   . Hypertensive heart disease   . Abdominal aortic aneurysm      measuring 3.6 x 4.0 cm by ultrasound May 2012   . Migraine   . Joint pain   . Hypertension     Past Surgical History  Procedure Date  . Other surgical history     Bone  Spur tooken off left elbow  . Cardiac catheterization     Normal coronary arteries    Allergies/Intolerances Allergies  Allergen Reactions  . Erythromycin Other (See Comments)    unknown  . Lisinopril Cough  . Procaine Hcl Other (See Comments)    Pt's heart races.    Home Medications Medication List  As of 08/19/2011 10:33 AM     ALPRAZolam 0.5 MG tablet   Commonly known as: XANAX   Take 0.5 mg by mouth as needed. For anxiety.      aspirin 325 MG tablet   Take 1 tablet (325 mg total) by mouth daily.      atenolol 50 MG tablet   Commonly known as: TENORMIN   Take 1 tablet (50 mg total) by mouth daily.      atorvastatin 20 MG tablet   Commonly known as: LIPITOR   Take 1 tablet (20 mg total) by mouth daily.      digoxin 0.25 MG tablet   Commonly known as: LANOXIN   take 1 tablet by mouth once daily      losartan-hydrochlorothiazide 100-12.5 MG per tablet   Commonly known as: HYZAAR   Take 1 tablet by mouth daily.      multivitamin tablet   Take 1 tablet by mouth daily.      propranolol 10 MG tablet   Commonly known as: INDERAL   Take 1 tablet (10 mg total) by mouth 4 (four) times daily as needed. For palpitations      vitamin C 500 MG tablet   Commonly known as: ASCORBIC ACID  Take 500 mg by mouth daily.      Vitamin D 1000 UNITS capsule   Take 1,000 Units by mouth daily.      vitamin E 400 UNIT capsule   Take 400 Units by mouth daily.      ZETIA 10 MG tablet   Generic drug: ezetimibe   take 1 tablet by mouth once daily      Family History Family History  Problem Relation Age of Onset  . Lymphoma Father 45  . Arrhythmia Sister     Social History Social History  . Marital Status: Single    Spouse Name: N/A    Number of Children: N/A  . Years of Education: N/A   Social History Main Topics  . Smoking status: Former Smoker -- 2.0 packs/day for 20 years    Types: Cigarettes    Quit date: 04/13/1969  . Smokeless tobacco: Never Used  . Alcohol Use:  No  . Drug Use: No  . Sexually Active: Not on file   Review of Systems General:  No chills, fever, night sweats or weight changes.  Cardiovascular:  No chest pain, dyspnea on exertion, edema, orthopnea, palpitations, paroxysmal nocturnal dyspnea. Dermatological: No rash, lesions/masses Respiratory: No cough, dyspnea Urologic: No hematuria, dysuria Abdominal:   No nausea, vomiting, diarrhea, bright red blood per rectum, melena, or hematemesis Neurologic:  No visual changes, wkns, changes in mental status. All other systems reviewed and are otherwise negative except as noted above.  Physical Exam Blood pressure 141/78, pulse 62, resp. rate 20, height 6\' 8"  (2.032 m), weight 250 lb (113.399 kg), SpO2 96.00%.  General: Well developed, well appearing 61 year old male, in no acute distress. HEENT: Normocephalic, atraumatic. EOMs intact. Sclera nonicteric. Oropharynx clear.  Neck: Supple without bruits. No JVD. Lungs:  Respirations regular and unlabored, CTA bilaterally. No wheezes, rales or rhonchi. Heart: RRR. S1, S2 present. No murmurs, rub, S3 or S4. Abdomen: Soft, non-tender, non-distended. BS present x 4 quadrants. No hepatosplenomegaly.  Extremities: No clubbing, cyanosis or edema. DP/PT/Radials 2+ and equal bilaterally. Psych: Normal affect. Neuro: Alert and oriented X 3. Moves all extremities spontaneously.   Labs Lab 08/18/11 1333  NA 137  K 3.6  CL 103  CO2 27  BUN 10  CREATININE 0.9  CALCIUM 9.1  PROT --  BILITOT --  ALKPHOS --  ALT --  AST --  GLUCOSE 131*   No magnesium or thyroid studies found.  Radiology/Studies No results found.  Echocardiogram  - Left ventricle: The cavity size was normal. Wall thickness was normal. Systolic function was low normal to mildly reduced. The estimated ejection fraction was in the range of 50% to 55%. Wall motion was normal; there were no regional wall motion abnormalities. Doppler parameters are consistent with abnormal left  ventricular relaxation (grade 1 diastolic dysfunction). - Aortic valve: There was no stenosis. - Aorta: Dilated aortic root. Aortic root dimension: 41mm(ED). - Mitral valve: Mildly calcified annulus. Trivial regurgitation. - Left atrium: The atrium was mildly dilated. - Right ventricle: The cavity size was normal. Systolic function was normal. - Right atrium: The atrium was mildly dilated. - Tricuspid valve: Peak RV-RA gradient: 16mm Hg (S). - Pulmonary arteries: PA systolic pressure 22-26 mmHg. - Systemic veins: IVC measured 1.9 cm with normal respirophasic variation, suggesting RA pressure 6-10 mmHg.  12-lead ECG from 07/29/2011 - normal sinus rhythm at 78 bpm with normal intervals; PR 122 ms, QRS 100 ms, QT/QTc 358/408 ms.  Event monitor strips were reviewed by  Dr. Ladona Ridgel  Assessment and Plan 1.  Paroxysmal ventricular tachycardia - symptomatic; patient has normal LV function and remains hemodynamically stable; continue beta blocker and agree with resuming atenolol; Dr. Ladona Ridgel discussed all treatment options with the patient including continuing medical therapy versus an EP study +/- ablation; the patient will continue medical therapy for now and follow-up as an outpatient; please see Dr. Lubertha Basque full recommendations belo  The patient was seen, examined and plan of care formulated with Dr. Lewayne Bunting. Signed, Minda Meo., PA-C 08/19/2011, 10:21 AM  EP Attending  Patient seen and examined. An unusual situation of a man with symptomatic palpitations and wide QRS tachycardia in the setting of normal LV function and no CAD. I have discussed proceeding with EPS/RFA of his wide QRS tachycardia vs emperic medical therapy with flecainide. He will consider his options and see [place] back in several weeks as an outpatient.  Lewayne Bunting, M.D.

## 2011-08-19 NOTE — H&P (View-Only) (Signed)
Rodney Hardy Date of Birth  05-18-1950 [facility] [address]    [address] [place], [place]  [address] [phone]  Fax  [phone]   Problem List: 1. HTN 2. Ventricular Tachycardia   History of Present Illness:  61 YO with a history of PAF, HTN, hyperlipidemia.   He presented to the emergency room with some palpitations several weeks ago. We'll place an event monitor on him he was found to have sustained episodes of wide-complex tachycardia. Appears to be consistent with ventricular tachycardia. He can feel immediately when he goes into this dysrhythmia. He gets some dizziness and some shortness of breath but he's never had any episodes of syncope.    Current Outpatient Prescriptions on File Prior to Visit  Medication Sig Dispense Refill  . ALPRAZolam (XANAX) 0.5 MG tablet Take 0.5 mg by mouth as needed. For anxiety.      . Ascorbic Acid (VITAMIN C) 500 MG tablet Take 500 mg by mouth daily.        Marland Kitchen aspirin 325 MG tablet Take 1 tablet (325 mg total) by mouth daily.      Marland Kitchen atorvastatin (LIPITOR) 20 MG tablet Take 1 tablet (20 mg total) by mouth daily.  30 tablet  11  . carvedilol (COREG) 25 MG tablet Take 1 tablet (25 mg total) by mouth 2 (two) times daily.  60 tablet  11  . Cholecalciferol (VITAMIN D) 1000 UNITS capsule Take 1,000 Units by mouth daily.        . digoxin (LANOXIN) 0.25 MG tablet take 1 tablet by mouth once daily  30 tablet  6  . losartan-hydrochlorothiazide (HYZAAR) 100-12.5 MG per tablet Take 1 tablet by mouth daily.  90 tablet  3  . Multiple Vitamin (MULTIVITAMIN) tablet Take 1 tablet by mouth daily.        . vitamin E 400 UNIT capsule Take 400 Units by mouth daily.        Marland Kitchen ZETIA 10 MG tablet take 1 tablet by mouth once daily  30 tablet  6    Allergies  Allergen Reactions  . Erythromycin Other (See Comments)    unknown  . Lisinopril Cough  . Procaine Hcl Other (See Comments)    Pt's heart races.    Past Medical History  Diagnosis  Date  . Hyperlipidemia 1995  . PAF (paroxysmal atrial fibrillation)   . Hypertensive heart disease   . Abdominal aortic aneurysm   . Migraine   . Joint pain   . Hypertension     Past Surgical History  Procedure Date  . Other surgical history     Bone Spur tooken off left elbow  . Cardiac catheterization     Normal coronary arteries    History  Smoking status  . Former Smoker -- 2.0 packs/day for 20 years  . Types: Cigarettes  . Quit date: 04/13/1969  Smokeless tobacco  . Never Used    History  Alcohol Use No    Family History  Problem Relation Age of Onset  . Lymphoma Father 61  . Arrhythmia Sister     Reviw of Systems:  Reviewed in the HPI.  All other systems are negative.  Physical Exam: BP 114/74  Pulse 60  Ht 6\' 8"  (2.032 m)  Wt 252 lb (114.306 kg)  BMI 27.68 kg/m2 The patient is alert and oriented x 3.  The mood and affect are normal.   Skin: warm and dry.  Color is normal.    HEENT:  the sclera are nonicteric.  The mucous membranes are moist.  The carotids are 2+ without bruits.  There is no thyromegaly.  There is no JVD.    Lungs: clear.  The chest wall is non tender.    Heart: regular rate with a normal S1 and S2.  There are no murmurs, gallops, or rubs. The PMI is not displaced.     Abdomen: good bowel sounds.  There is no guarding or rebound.  There is no hepatosplenomegaly or tenderness.  There are no masses.   Extremities:  no clubbing, cyanosis, or edema.  The legs are without rashes.  The distal pulses are intact.   Neuro:  Cranial nerves II - XII are intact.  Motor and sensory functions are intact.    The gait is normal.  ECG: Normal sinus rhythm, normal EKG.   Telemetry monitor reveals episodes of regular wide-complex tachycardia. These episodes are sustained. 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  I have reviewed these with Dr. Graciela Husbands who agrees that they may be due to ventricular tachycardia.  Assessment / Plan:

## 2011-08-19 NOTE — OR Nursing (Signed)
Tegaderm dressing applied, site level 0, bedrest begins at 0845 

## 2011-08-19 NOTE — OR Nursing (Signed)
Meal served 

## 2011-08-19 NOTE — CV Procedure (Signed)
    Cardiac Catheterization Procedure Note  Name: ARLOW SPIERS MRN: [id number] DOB: Oct 03, 1950  Procedure: Left Heart Cath, Selective Coronary Angiography, LV angiography  Indication: Ventricular tachycardia.   Medications:  Sedation:  1 mg IV Versed, 25 mcg IV Fentanyl  Contrast:  90 mL Omnipaque  Procedural details: The right groin was prepped, draped, and anesthetized with 1% lidocaine. Using modified Seldinger technique, a 4 French sheath was introduced into the right femoral artery. Standard Judkins catheters were used for coronary angiography and left ventriculography.  An AL1 catheter was used to engage the right coronary artery.Catheter exchanges were performed over a guidewire. There were no immediate procedural complications. The patient was transferred to the post catheterization recovery area for further monitoring.   Procedural Findings:  Hemodynamics: AO:  130/73   mmHg LV:  132/19    mmHg LVEDP: 20  mmHg  Coronary angiography: Coronary dominance: Right   Left Main:  Normal in size with minor calcifications. No significant obstructive disease.  Left Anterior Descending (LAD):  Normal in size with minor irregularities and no evidence of obstructive disease.  1st diagonal (D1):  Normal in size and free of significant disease.  2nd diagonal (D2):  Large in size with minor irregularities.  3rd diagonal (D3):  Very small in size and free of significant disease.  Circumflex (LCx):  Normal in size and nondominant. There is a 20% lesion proximally but otherwise no evidence of obstructive disease.  1st obtuse marginal:  Very small in size.  2nd obtuse marginal:  Normal in size and free of significant disease.  3rd obtuse marginal:  Medium in size without significant disease.   Right Coronary Artery: Normal in size and dominant. The vessel has no significant disease.  posterior descending artery: Small in size and free of significant disease. There is a  large RV branch that supplies part of the PDA distribution.  posterior lateral branch:  Small in size and free of significant disease.  Left ventriculography: Left ventricular systolic function is normal , LVEF is estimated at 60 %, there is no significant mitral regurgitation   Final Conclusions:  1. No significant coronary artery disease. 2. Normal LV systolic function with mildly elevated left ventricular end-diastolic pressure.  Recommendations:  Electrophysiology consultation for evaluation and management of wide complex tachycardia.  Lorine Bears MD, [facility] 08/19/2011, 8:31 AM

## 2011-08-19 NOTE — OR Nursing (Signed)
Discharge instructions reviewed and signed, pt stated understanding, ambulated in hall without difficulty, site level 0, transported to daughter's car via wheelchair 

## 2011-08-19 NOTE — OR Nursing (Signed)
Dr Arida at bedside to discuss results and treatment plan with pt and family 

## 2011-08-19 NOTE — OR Nursing (Signed)
Rick Duff PA at bedside to discuss results and treatment plan with pt and family

## 2011-08-19 NOTE — Interval H&P Note (Signed)
History and Physical Interval Note:  08/19/2011 7:54 AM  Rodney Hardy  has presented today for surgery, with the diagnosis of ventricular tachycardia.  The various methods of treatment have been discussed with the patient and family. After consideration of risks, benefits and other options for treatment, the patient has consented to  Procedure(s) (LRB): JV LEFT HEART CATHETERIZATION WITH CORONARY ANGIOGRAM (N/A) as a surgical intervention .  The patients' history has been reviewed, patient examined, no change in status, stable for surgery.  I have reviewed the patients' chart and labs.  Questions were answered to the patient's satisfaction.     Lorine Bears

## 2011-08-24 ENCOUNTER — Telehealth: Payer: Self-pay | Admitting: Internal Medicine

## 2011-08-24 NOTE — Telephone Encounter (Signed)
Please return call to patient regarding medications, he can be reached at [phone].

## 2011-08-24 NOTE — Telephone Encounter (Signed)
Spoke with patient.  He has a follow up on 09/10/11 with Dr Ladona Ridgel.  He has had several episode since discharge and wanted to know if he could go ahead and start Flecainide.  ? How much if so.  I told him I would discuss with Dr Ladona Ridgel tomorrow and call him back

## 2011-08-25 ENCOUNTER — Telehealth: Payer: Self-pay | Admitting: Internal Medicine

## 2011-08-25 MED ORDER — FLECAINIDE ACETATE 100 MG PO TABS: 100.0000 mg | ORAL_TABLET | Freq: Two times a day (BID) | ORAL | Status: DC

## 2011-08-25 NOTE — Telephone Encounter (Signed)
New Problem:     Patient called in with questions about any restrictions for his GXT.  Please call back.

## 2011-08-25 NOTE — Telephone Encounter (Signed)
LMOM for patient to start Flecainide 100mg  bid and keep his follow up appointment on 09/10/11 but to come in at 2pm to have a GXT due to the starting of Flecainide

## 2011-08-25 NOTE — Telephone Encounter (Signed)
Hold Atenolol the day of GXT.  Patient aware

## 2011-08-25 NOTE — Telephone Encounter (Signed)
Discussed with Dr Ladona Ridgel.  Patient to start Flecainide 100mg  twice daily and keep his follow up on 09/10/11.  We will change his follow up to a GXT and explain to him the importance of keeping the appointment.  Do not do any strenuous activity until after the GXT

## 2011-08-27 ENCOUNTER — Other Ambulatory Visit: Payer: Self-pay | Admitting: Cardiovascular Disease

## 2011-09-10 ENCOUNTER — Encounter: Payer: BC Managed Care – PPO | Admitting: Internal Medicine

## 2011-09-10 ENCOUNTER — Ambulatory Visit (INDEPENDENT_AMBULATORY_CARE_PROVIDER_SITE_OTHER): Payer: BC Managed Care – PPO | Admitting: Internal Medicine

## 2011-09-10 ENCOUNTER — Ambulatory Visit: Payer: BC Managed Care – PPO | Admitting: Cardiovascular Disease

## 2011-09-10 ENCOUNTER — Encounter: Payer: Self-pay | Admitting: Internal Medicine

## 2011-09-10 DIAGNOSIS — I472 Ventricular tachycardia, unspecified: Secondary | ICD-10-CM

## 2011-09-10 NOTE — Progress Notes (Signed)
Exercise Treadmill Test  Pre-Exercise Testing Evaluation Rhythm: normal sinus  Rate: 67   PR:  .19 QRS:  .11  QT:  .40 QTc: .43            Test  Exercise Tolerance Test Ordering MD: Lewayne Bunting, MD  Interpreting MD: Lewayne Bunting, MD  Unique Test No: 1  Treadmill:  2  Indication for ETT: Palpitations,VT with recent Flecainide started  Contraindication to ETT: No   Stress Modality: exercise - treadmill  Cardiac Imaging Performed: non   Protocol: standard Bruce - maximal  Max BP:  173/77  Max MPHR (bpm):  158 85% MPR (bpm):  134  MPHR obtained (bpm):  146 % MPHR obtained:  92%  Reached 85% MPHR (min:sec):  8:00 Total Exercise Time (min-sec):  9:00  Workload in METS:  10.4 Borg Scale: 16  Reason ETT Terminated:  Fatigue    ST Segment Analysis At Rest: normal ST segments - no evidence of significant ST depression With Exercise: no evidence of significant ST depression  Other Information Arrhythmia:  No Angina during ETT:  absent (0) Quality of ETT:  diagnostic  ETT Interpretation:  normal - no evidence of ischemia by ST analysis  Comments: Clinically and electrically negative. Minimal PVC's with exercise.  Recommendations: Continue current meds.

## 2012-01-15 ENCOUNTER — Other Ambulatory Visit: Payer: Self-pay | Admitting: Cardiology

## 2012-01-15 DIAGNOSIS — I714 Abdominal aortic aneurysm, without rupture: Secondary | ICD-10-CM

## 2012-01-17 ENCOUNTER — Other Ambulatory Visit: Payer: Self-pay | Admitting: Cardiovascular Disease

## 2012-01-18 NOTE — Telephone Encounter (Signed)
Fax Received. Refill Completed. Quinnley Colasurdo Chowoe (R.M.A)   

## 2012-01-21 ENCOUNTER — Encounter (INDEPENDENT_AMBULATORY_CARE_PROVIDER_SITE_OTHER): Payer: BC Managed Care – PPO

## 2012-01-21 DIAGNOSIS — I714 Abdominal aortic aneurysm, without rupture: Secondary | ICD-10-CM

## 2012-01-26 ENCOUNTER — Telehealth: Payer: Self-pay | Admitting: Cardiovascular Disease

## 2012-01-26 ENCOUNTER — Ambulatory Visit (INDEPENDENT_AMBULATORY_CARE_PROVIDER_SITE_OTHER): Payer: BC Managed Care – PPO | Admitting: Family Medicine

## 2012-01-26 VITALS — HR 67 | Temp 98.7°F | Resp 16 | Ht >= 80 in | Wt 245.0 lb

## 2012-01-26 DIAGNOSIS — M79609 Pain in unspecified limb: Secondary | ICD-10-CM

## 2012-01-26 DIAGNOSIS — M79643 Pain in unspecified hand: Secondary | ICD-10-CM

## 2012-01-26 DIAGNOSIS — T148XXA Other injury of unspecified body region, initial encounter: Secondary | ICD-10-CM

## 2012-01-26 DIAGNOSIS — W540XXA Bitten by dog, initial encounter: Secondary | ICD-10-CM

## 2012-01-26 MED ORDER — AMOXICILLIN-POT CLAVULANATE 875-125 MG PO TABS: 1.0000 | ORAL_TABLET | Freq: Two times a day (BID) | ORAL | Status: DC

## 2012-01-26 MED ORDER — TRAMADOL HCL 50 MG PO TABS: 50.0000 mg | ORAL_TABLET | Freq: Three times a day (TID) | ORAL | Status: DC | PRN

## 2012-01-26 NOTE — Patient Instructions (Signed)

## 2012-01-26 NOTE — Progress Notes (Signed)
Verbal consent obtained from the patient.  Local anesthesia with 4cc Lidocaine 2% without epinephrine.  Wound scrubbed with soap and water and rinsed.  Wound closed with 7 5-0 Prolene horizontal mattress (#5) and simple interrupted (#2) sutures.  Wound cleansed and dressed.

## 2012-01-26 NOTE — Progress Notes (Signed)
[facility]:  Office Visit  Chief Complaint:  Chief Complaint  Patient presents with  . Animal Bite    Dog Bite today    HPI: Rodney Hardy is a 61 y.o. male who complains of  Dog bite to the right hand this afternoon. He was playing with his dog with a stick and the dog bit him by accident. He is UTD on his TDaP. His dog is UTD on the rabies vaccine. Patient on ASA for Paroxysmal A. Fib.  Past Medical History  Diagnosis Date  . Hyperlipidemia 1995  . PAF (paroxysmal atrial fibrillation)   . Hypertensive heart disease   . Abdominal aortic aneurysm   . Migraine   . Joint pain   . Hypertension    Past Surgical History  Procedure Date  . Other surgical history     Bone Spur tooken off left elbow  . Cardiac catheterization     Normal coronary arteries   History   Social History  . Marital Status: Single    Spouse Name: N/A    Number of Children: N/A  . Years of Education: N/A   Social History Main Topics  . Smoking status: Former Smoker -- 2.0 packs/day for 20 years    Types: Cigarettes    Quit date: 04/13/1969  . Smokeless tobacco: Never Used  . Alcohol Use: No  . Drug Use: No  . Sexually Active: None   Other Topics Concern  . None   Social History Narrative  . None   Family History  Problem Relation Age of Onset  . Lymphoma Father 76  . Arrhythmia Sister    Allergies  Allergen Reactions  . Erythromycin Other (See Comments)    unknown  . Lisinopril Cough  . Procaine Hcl Other (See Comments)    Pt's heart races.   Prior to Admission medications   Medication Sig Start Date End Date Taking? Authorizing Provider  Ascorbic Acid (VITAMIN C) 500 MG tablet Take 500 mg by mouth daily.     Yes Historical Provider, MD  aspirin 325 MG tablet Take 1 tablet (325 mg total) by mouth daily. 07/30/11 07/29/12 Yes Vesta Mixer, MD  atenolol (TENORMIN) 50 MG tablet Take 1 tablet (50 mg total) by mouth daily. 08/18/11 08/17/12 Yes Vesta Mixer, MD  Cholecalciferol (VITAMIN D) 1000 UNITS capsule Take 1,000 Units by mouth daily.     Yes Historical Provider, MD  digoxin (LANOXIN) 0.25 MG tablet take 1 tablet by mouth once daily 08/27/11  Yes Vesta Mixer, MD  flecainide (TAMBOCOR) 100 MG tablet Take 1 tablet (100 mg total) by mouth 2 (two) times daily. 08/25/11 08/24/12 Yes Marinus Maw, MD  losartan-hydrochlorothiazide (HYZAAR) 100-12.5 MG per tablet Take 1 tablet by mouth daily. 05/04/11  Yes Vesta Mixer, MD  Multiple Vitamin (MULTIVITAMIN) tablet Take 1 tablet by mouth daily.     Yes Historical Provider, MD  vitamin E 400 UNIT capsule Take 400 Units by mouth daily.     Yes Historical Provider, MD  ZETIA 10 MG tablet take 1 tablet by mouth once daily 08/27/11  Yes Vesta Mixer, MD     ROS: The patient denies fevers, chills, night sweats, unintentional weight loss, chest pain, palpitations, wheezing, dyspnea on exertion, nausea, vomiting, abdominal pain, dysuria, hematuria, melena, numbness, weakness, or tingling.   All other systems have been reviewed and were otherwise negative with the exception of those mentioned in the HPI and as above.  PHYSICAL EXAM: Filed Vitals:   01/26/12 1827  Pulse: 67  Temp: 98.7 F (37.1 C)  Resp: 16   Filed Vitals:   01/26/12 1827  Height: 6\' 8"  (2.032 m)  Weight: 245 lb (111.131 kg)   Body mass index is 26.91 kg/(m^2).  General: Alert, no acute distress HEENT:  Normocephalic, atraumatic, oropharynx patent.  Cardiovascular:  Regular rate and rhythm, no rubs murmurs or gallops.  No Carotid bruits, radial pulse intact. No pedal edema.  Respiratory: Clear to auscultation bilaterally.  No wheezes, rales, or rhonchi.  No cyanosis, no use of accessory musculature GI: No organomegaly, abdomen is soft and non-tender, positive bowel sounds.  No masses. Skin: + right hand wound/laceration. V  shape, 1 inch by inch , clean wound, no tendon. Laceration  On hyperthenar eminence. ROM and  sensation intact. 5/5 strength. + radial pulse. Neurologic: Facial musculature symmetric. Psychiatric: Patient is appropriate throughout our interaction. Lymphatic: No cervical lymphadenopathy Musculoskeletal: Gait intact.   LABS: Results for orders placed during the hospital encounter of 08/19/11  POCT I-STAT 7, (LYTES, BLD GAS, ICA,H+H)      Component Value Range   pH, Arterial 7.395  7.350 - 7.450   pCO2 arterial 37.9  35.0 - 45.0 mmHg   pO2, Arterial 64.0 (*) 80.0 - 100.0 mmHg   Bicarbonate 23.2  20.0 - 24.0 mEq/L   TCO2 24  0 - 100 mmol/L   O2 Saturation 92.0     Acid-base deficit 1.0  0.0 - 2.0 mmol/L   Sodium 138  135 - 145 mEq/L   Potassium 3.6  3.5 - 5.1 mEq/L   Calcium, Ion 1.12  1.12 - 1.32 mmol/L   HCT 41.0  39.0 - 52.0 %   Hemoglobin 13.9  13.0 - 17.0 g/dL   Sample type ARTERIAL       EKG/XRAY:   Primary read interpreted by Dr. Conley Rolls at [facility].   ASSESSMENT/PLAN: Encounter Diagnoses  Name Primary?  . Dog bite Yes  . Wound, open   . Hand pain    Wound care as directed Augmentin BID x 10 days Tramadol prn pain Will fax animal control F/u as directed    Jacobi Nile PHUONG, DO 01/26/2012 7:30 PM

## 2012-01-26 NOTE — Telephone Encounter (Signed)
New problem    Returning call back to nurse.   

## 2012-01-26 NOTE — Telephone Encounter (Signed)
lmtcb

## 2012-01-28 ENCOUNTER — Other Ambulatory Visit (INDEPENDENT_AMBULATORY_CARE_PROVIDER_SITE_OTHER): Payer: BC Managed Care – PPO

## 2012-01-28 ENCOUNTER — Ambulatory Visit (INDEPENDENT_AMBULATORY_CARE_PROVIDER_SITE_OTHER): Payer: BC Managed Care – PPO | Admitting: Internal Medicine

## 2012-01-28 ENCOUNTER — Encounter: Payer: Self-pay | Admitting: Internal Medicine

## 2012-01-28 VITALS — BP 118/62 | HR 68 | Temp 98.2°F | Resp 16 | Ht >= 80 in | Wt 250.0 lb

## 2012-01-28 DIAGNOSIS — E785 Hyperlipidemia, unspecified: Secondary | ICD-10-CM

## 2012-01-28 DIAGNOSIS — I4891 Unspecified atrial fibrillation: Secondary | ICD-10-CM

## 2012-01-28 DIAGNOSIS — Z0000 Encounter for general adult medical examination without abnormal findings: Secondary | ICD-10-CM | POA: Insufficient documentation

## 2012-01-28 DIAGNOSIS — I480 Paroxysmal atrial fibrillation: Secondary | ICD-10-CM

## 2012-01-28 DIAGNOSIS — Z23 Encounter for immunization: Secondary | ICD-10-CM

## 2012-01-28 DIAGNOSIS — R7309 Other abnormal glucose: Secondary | ICD-10-CM

## 2012-01-28 DIAGNOSIS — I10 Essential (primary) hypertension: Secondary | ICD-10-CM

## 2012-01-28 LAB — CBC WITH DIFFERENTIAL/PLATELET
Basophils Absolute: 0.1 10*3/uL (ref 0.0–0.1)
Eosinophils Absolute: 0.1 10*3/uL (ref 0.0–0.7)
Eosinophils Relative: 2.5 % (ref 0.0–5.0)
HCT: 43.3 % (ref 39.0–52.0)
Lymphocytes Relative: 26.8 % (ref 12.0–46.0)
Monocytes Absolute: 0.5 10*3/uL (ref 0.1–1.0)
Monocytes Relative: 9 % (ref 3.0–12.0)
Neutro Abs: 3.6 10*3/uL (ref 1.4–7.7)
Platelets: 251 10*3/uL (ref 150.0–400.0)
WBC: 6 10*3/uL (ref 4.5–10.5)

## 2012-01-28 LAB — COMPREHENSIVE METABOLIC PANEL
CO2: 28 mEq/L (ref 19–32)
Calcium: 9.4 mg/dL (ref 8.4–10.5)
Creatinine, Ser: 0.8 mg/dL (ref 0.4–1.5)
GFR: 100.91 mL/min (ref >60.00)
Glucose, Bld: 99 mg/dL (ref 70–99)
Sodium: 134 mEq/L — ABNORMAL LOW (ref 135–145)
Total Bilirubin: 0.8 mg/dL (ref 0.3–1.2)
Total Protein: 7.2 g/dL (ref 6.0–8.3)

## 2012-01-28 LAB — LIPID PANEL
Total CHOL/HDL Ratio: 2
VLDL: 46.8 mg/dL — ABNORMAL HIGH (ref 0.0–40.0)

## 2012-01-28 LAB — FECAL OCCULT BLOOD, GUAIAC: Fecal Occult Blood: NEGATIVE

## 2012-01-28 NOTE — Patient Instructions (Signed)
Health Maintenance, Males A healthy lifestyle and preventative care can promote health and wellness.  Maintain regular health, dental, and eye exams.  Eat a healthy diet. Foods like vegetables, fruits, whole grains, low-fat dairy products, and lean protein foods contain the nutrients you need without too many calories. Decrease your intake of foods high in solid fats, added sugars, and salt. Get information about a proper diet from your caregiver, if necessary.  Regular physical exercise is one of the most important things you can do for your health. Most adults should get at least 150 minutes of moderate-intensity exercise (any activity that increases your heart rate and causes you to sweat) each week. In addition, most adults need muscle-strengthening exercises on 2 or more days a week.   Maintain a healthy weight. The body mass index (BMI) is a screening tool to identify possible weight problems. It provides an estimate of body fat based on height and weight. Your caregiver can help determine your BMI, and can help you achieve or maintain a healthy weight. For adults 20 years and older:  A BMI below 18.5 is considered underweight.  A BMI of 18.5 to 24.9 is normal.  A BMI of 25 to 29.9 is considered overweight.  A BMI of 30 and above is considered obese.  Maintain normal blood lipids and cholesterol by exercising and minimizing your intake of saturated fat. Eat a balanced diet with plenty of fruits and vegetables. Blood tests for lipids and cholesterol should begin at age 20 and be repeated every 5 years. If your lipid or cholesterol levels are high, you are over 50, or you are a high risk for heart disease, you may need your cholesterol levels checked more frequently.Ongoing high lipid and cholesterol levels should be treated with medicines, if diet and exercise are not effective.  If you smoke, find out from your caregiver how to quit. If you do not use tobacco, do not start.  If you  choose to drink alcohol, do not exceed 2 drinks per day. One drink is considered to be 12 ounces (355 mL) of beer, 5 ounces (148 mL) of wine, or 1.5 ounces (44 mL) of liquor.  Avoid use of street drugs. Do not share needles with anyone. Ask for help if you need support or instructions about stopping the use of drugs.  High blood pressure causes heart disease and increases the risk of stroke. Blood pressure should be checked at least every 1 to 2 years. Ongoing high blood pressure should be treated with medicines if weight loss and exercise are not effective.  If you are 45 to 63 years old, ask your caregiver if you should take aspirin to prevent heart disease.  Diabetes screening involves taking a blood sample to check your fasting blood sugar level. This should be done once every 3 years, after age 45, if you are within normal weight and without risk factors for diabetes. Testing should be considered at a younger age or be carried out more frequently if you are overweight and have at least 1 risk factor for diabetes.  Colorectal cancer can be detected and often prevented. Most routine colorectal cancer screening begins at the age of 50 and continues through age 75. However, your caregiver may recommend screening at an earlier age if you have risk factors for colon cancer. On a yearly basis, your caregiver may provide home test kits to check for hidden blood in the stool. Use of a small camera at the end of a tube,   to directly examine the colon (sigmoidoscopy or colonoscopy), can detect the earliest forms of colorectal cancer. Talk to your caregiver about this at age 50, when routine screening begins. Direct examination of the colon should be repeated every 5 to 10 years through age 75, unless early forms of pre-cancerous polyps or small growths are found.  Hepatitis C blood testing is recommended for all people born from 1945 through 1965 and any individual with known risks for hepatitis C.  Healthy  men should no longer receive prostate-specific antigen (PSA) blood tests as part of routine cancer screening. Consult with your caregiver about prostate cancer screening.  Testicular cancer screening is not recommended for adolescents or adult males who have no symptoms. Screening includes self-exam, caregiver exam, and other screening tests. Consult with your caregiver about any symptoms you have or any concerns you have about testicular cancer.  Practice safe sex. Use condoms and avoid high-risk sexual practices to reduce the spread of sexually transmitted infections (STIs).  Use sunscreen with a sun protection factor (SPF) of 30 or greater. Apply sunscreen liberally and repeatedly throughout the day. You should seek shade when your shadow is shorter than you. Protect yourself by wearing long sleeves, pants, a wide-brimmed hat, and sunglasses year round, whenever you are outdoors.  Notify your caregiver of new moles or changes in moles, especially if there is a change in shape or color. Also notify your caregiver if a mole is larger than the size of a pencil eraser.  A one-time screening for abdominal aortic aneurysm (AAA) and surgical repair of large AAAs by sound wave imaging (ultrasonography) is recommended for ages 65 to 75 years who are current or former smokers.  Stay current with your immunizations. Document Released: 09/26/2007 Document Revised: 06/22/2011 Document Reviewed: 08/25/2010 [employer] Patient Information 2013 [employer].  

## 2012-01-28 NOTE — Progress Notes (Signed)
  Subjective:    Patient ID: Rodney Hardy, male    DOB: [birth year], 61 y.o.   MRN: [id number]  HPI  New to me for a physical, he feels well and offers no complaints.  Review of Systems  Constitutional: Negative.   HENT: Negative.   Eyes: Negative.   Respiratory: Negative.   Cardiovascular: Negative.   Gastrointestinal: Negative.   Genitourinary: Negative.   Musculoskeletal: Negative.   Skin: Negative.   Neurological: Negative.   Hematological: Negative.   Psychiatric/Behavioral: Negative.        Objective:   Physical Exam  Vitals reviewed. Constitutional: He is oriented to person, place, and time. He appears well-developed and well-nourished. No distress.  HENT:  Head: Normocephalic and atraumatic.  Mouth/Throat: Oropharynx is clear and moist. No oropharyngeal exudate.  Eyes: Conjunctivae normal are normal. Right eye exhibits no discharge. Left eye exhibits no discharge. No scleral icterus.  Neck: Normal range of motion. Neck supple. No JVD present. No tracheal deviation present. No thyromegaly present.  Cardiovascular: Normal rate, regular rhythm, normal heart sounds and intact distal pulses.  Exam reveals no gallop and no friction rub.   No murmur heard. Pulmonary/Chest: Effort normal and breath sounds normal. No stridor. No respiratory distress. He has no wheezes. He has no rales. He exhibits no tenderness.  Abdominal: Soft. Bowel sounds are normal. He exhibits no distension and no mass. There is no tenderness. There is no rebound and no guarding. Hernia confirmed negative in the right inguinal area and confirmed negative in the left inguinal area.  Genitourinary: Prostate normal, testes normal and penis normal. Rectal exam shows no external hemorrhoid, no internal hemorrhoid, no fissure, no mass, no tenderness and anal tone normal. Guaiac negative stool. Prostate is not enlarged and not tender. Right testis shows no mass, no swelling and no tenderness. Right testis is  descended. Left testis shows no mass, no swelling and no tenderness. Left testis is descended. Circumcised. No penile erythema or penile tenderness. No discharge found.  Musculoskeletal: Normal range of motion. He exhibits no edema and no tenderness.  Lymphadenopathy:    He has no cervical adenopathy.       Right: No inguinal adenopathy present.       Left: No inguinal adenopathy present.  Neurological: He is oriented to person, place, and time.  Skin: Skin is warm and dry. No rash noted. He is not diaphoretic. No erythema. No pallor.  Psychiatric: He has a normal mood and affect. His behavior is normal. Judgment and thought content normal.      Lab Results  Component Value Date   HGB 13.9 08/19/2011   HCT 41.0 08/19/2011   GLUCOSE 131* 08/18/2011   CHOL 156 04/30/2011   TRIG 237.0* 04/30/2011   HDL 50.80 04/30/2011   LDLDIRECT 68.0 04/30/2011   LDLCALC 72 01/28/2011   ALT 28 04/30/2011   AST 27 04/30/2011   NA 138 08/19/2011   K 3.6 08/19/2011   CL 103 08/18/2011   CREATININE 0.9 08/18/2011   BUN 10 08/18/2011   CO2 27 08/18/2011   PSA 1.79 08/14/2010      Assessment & Plan:

## 2012-01-29 NOTE — Assessment & Plan Note (Signed)
His BP is well controlled, I will check his lytes and renal function 

## 2012-01-29 NOTE — Assessment & Plan Note (Signed)
He has good rate and rhythm control, I will check his Dig level today

## 2012-01-29 NOTE — Assessment & Plan Note (Signed)
Exam done, vaccines were updated, colonoscopy referral done, labs ordered, pt ed material was given

## 2012-01-29 NOTE — Assessment & Plan Note (Signed)
I will check his a1c to see if he has developed DM II 

## 2012-02-04 ENCOUNTER — Ambulatory Visit (INDEPENDENT_AMBULATORY_CARE_PROVIDER_SITE_OTHER): Payer: BC Managed Care – PPO | Admitting: Physician Assistant

## 2012-02-04 VITALS — BP 132/82 | HR 61 | Temp 98.3°F | Resp 17 | Ht 79.0 in | Wt 249.0 lb

## 2012-02-04 DIAGNOSIS — S61409A Unspecified open wound of unspecified hand, initial encounter: Secondary | ICD-10-CM

## 2012-02-04 NOTE — Progress Notes (Signed)
HPI: Patient presents for suture removal. DOI 01/26/12 - dog bite. Patient reports it is doing well. Does admit that after stiches placed he noticed it got extremely red, warm, and swollen. States this has improved and is he is doing much better now.  Continues to take Augmentin, tomorrow is last dose.    PE: Wound well-healing.  Slight erythema and swelling, minimal tenderness to palpation. No drainage or fluctuance.   Procedure: #7 sutures removed without difficulty. Bandage applied  A/P:  Wound hand - continue Augmentin. Follow up if needed

## 2012-02-05 ENCOUNTER — Encounter: Payer: Self-pay | Admitting: Cardiovascular Disease

## 2012-02-05 ENCOUNTER — Ambulatory Visit (INDEPENDENT_AMBULATORY_CARE_PROVIDER_SITE_OTHER): Payer: BC Managed Care – PPO | Admitting: Cardiovascular Disease

## 2012-02-05 VITALS — BP 131/83 | HR 63 | Resp 18 | Ht 79.0 in | Wt 250.0 lb

## 2012-02-05 DIAGNOSIS — I472 Ventricular tachycardia, unspecified: Secondary | ICD-10-CM

## 2012-02-05 DIAGNOSIS — I714 Abdominal aortic aneurysm, without rupture, unspecified: Secondary | ICD-10-CM

## 2012-02-05 NOTE — Assessment & Plan Note (Signed)
Rai seems to be doing well. He's not had any recurrent episodes of ventricular tachycardia. He does have some occasional episodes of fast heart rate that Dr. Yetta Barre noted last month. I don't think that these are ventricular tachycardia. His symptoms are completely different.  We'll continue with his current medications. From my standpoint it does not matter whether use atenolol or carvedilol as both of these will be very acceptable for control his ventricular tachycardia. We'll continue the flecainide.

## 2012-02-05 NOTE — Progress Notes (Signed)
Rodney Hardy Date of Birth  1950/04/25 [facility] [address]    [address] [place], [place]  [address] [phone]  Fax  [phone]   Problem List: 1. HTN 2. Ventricular Tachycardia 3. Abdominal aortic aneurism  History of Present Illness:  61 YO with a history of PAF, HTN, hyperlipidemia.   Rodney Hardy is a 61 year old gentleman with a history of ventricular tachycardia. He was hospitalized in may of this year and a cardiac catheterization revealed no significant coronary artery disease. His ventricular tachycardia has been well-controlled on flecainide and atenolol.  Had a dog bite accident recently. He was playing catch with his dog and the dog accidentally bit him on the hand. He's recovering quite nicely from that.  He is well controlled on Atenolol and flecainide.  He was found to have an  AAA that has enlarged slightly.    Current Outpatient Prescriptions on File Prior to Visit  Medication Sig Dispense Refill  . amoxicillin-clavulanate (AUGMENTIN) 875-125 MG per tablet Take 1 tablet by mouth 2 (two) times daily.  20 tablet  0  . Ascorbic Acid (VITAMIN C) 500 MG tablet Take 500 mg by mouth daily.        Marland Kitchen aspirin 325 MG tablet Take 1 tablet (325 mg total) by mouth daily.      Marland Kitchen atenolol (TENORMIN) 50 MG tablet Take 1 tablet (50 mg total) by mouth daily.  30 tablet  5  . atorvastatin (LIPITOR) 20 MG tablet Take 20 mg by mouth daily.      . Cholecalciferol (VITAMIN D) 1000 UNITS capsule Take 1,000 Units by mouth daily.        . digoxin (LANOXIN) 0.25 MG tablet take 1 tablet by mouth once daily  30 tablet  6  . flecainide (TAMBOCOR) 100 MG tablet Take 1 tablet (100 mg total) by mouth 2 (two) times daily.  60 tablet  6  . losartan-hydrochlorothiazide (HYZAAR) 100-12.5 MG per tablet Take 1 tablet by mouth daily.  90 tablet  3  . Multiple Vitamin (MULTIVITAMIN) tablet Take 1 tablet by mouth daily.        . traMADol (ULTRAM) 50 MG tablet Take 1 tablet (50 mg  total) by mouth every 8 (eight) hours as needed for pain.  30 tablet  0  . vitamin E 400 UNIT capsule Take 400 Units by mouth daily.        Marland Kitchen ZETIA 10 MG tablet take 1 tablet by mouth once daily  30 tablet  6    Allergies  Allergen Reactions  . Erythromycin Other (See Comments)    unknown  . Lisinopril Cough  . Procaine Hcl Other (See Comments)    Pt's heart races.    Past Medical History  Diagnosis Date  . Hyperlipidemia 1995  . PAF (paroxysmal atrial fibrillation)   . Hypertensive heart disease   . Abdominal aortic aneurysm   . Migraine   . Joint pain   . Hypertension     Past Surgical History  Procedure Date  . Other surgical history     Bone Spur tooken off left elbow  . Cardiac catheterization     Normal coronary arteries    History  Smoking status  . Former Smoker -- 2.0 packs/day for 20 years  . Types: Cigarettes  . Quit date: 04/13/1969  Smokeless tobacco  . Never Used    History  Alcohol Use  . 9.0 oz/week  . 15 Cans of beer per week    Family  History  Problem Relation Age of Onset  . Lymphoma Father 71  . Arrhythmia Sister   . Cancer Neg Hx   . Alcohol abuse Neg Hx   . Arthritis Neg Hx   . Heart disease Neg Hx   . Hyperlipidemia Neg Hx   . Hypertension Neg Hx   . Stroke Neg Hx     Reviw of Systems:  Reviewed in the HPI.  All other systems are negative.  Physical Exam: BP 131/83  Pulse 63  Resp 18  Ht 6\' 7"  (2.007 m)  Wt 250 lb (113.399 kg)  BMI 28.16 kg/m2  SpO2 93% The patient is alert and oriented x 3.  The mood and affect are normal.   Skin: warm and dry.  Color is normal.    HEENT:   the sclera are nonicteric.  The mucous membranes are moist.  The carotids are 2+ without bruits.  There is no thyromegaly.  There is no JVD.    Lungs: clear.  The chest wall is non tender.    Heart: regular rate with a normal S1 and S2.  There are no murmurs, gallops, or rubs. The PMI is not displaced.     Abdomen: good bowel sounds.  There is  no guarding or rebound.  There is no hepatosplenomegaly or tenderness.  There are no masses.   Extremities:  no clubbing, cyanosis, or edema.  The legs are without rashes.  The distal pulses are intact.   Neuro:  Cranial nerves II - XII are intact.  Motor and sensory functions are intact.    The gait is normal.  ECG: 01/31/2012-normal sinus rhythm at 60 beats a minute. He has nonspecific IVCD.   Assessment / Plan:

## 2012-02-05 NOTE — Assessment & Plan Note (Signed)
His abdominal aortic aneurysm has grown slightly. We'll continue aggressive blood pressure control and beta blockade. We will recheck an aortic duplex scan in 6 months.

## 2012-02-05 NOTE — Patient Instructions (Addendum)
Your physician wants you to follow-up in: 6 months You will receive a reminder letter in the mail two months in advance. If you don't receive a letter, please call our office to schedule the follow-up appointment.  Your physician recommends that you return for lab work in: 6 months BMP

## 2012-02-12 ENCOUNTER — Encounter: Payer: Self-pay | Admitting: Cardiology

## 2012-02-26 ENCOUNTER — Telehealth: Payer: Self-pay | Admitting: Cardiovascular Disease

## 2012-02-26 ENCOUNTER — Other Ambulatory Visit: Payer: Self-pay

## 2012-02-26 MED ORDER — ATENOLOL 50 MG PO TABS: 50.0000 mg | ORAL_TABLET | Freq: Every day | ORAL | Status: DC

## 2012-02-26 NOTE — Telephone Encounter (Signed)
**Note De-Identified Zelta Enfield Obfuscation** Refill for Atenolol sent to [facility] to fill, pt. aware.

## 2012-02-26 NOTE — Telephone Encounter (Signed)
plz return call to pt [phone] to discuss medication

## 2012-03-16 ENCOUNTER — Other Ambulatory Visit: Payer: Self-pay | Admitting: Internal Medicine

## 2012-04-08 ENCOUNTER — Other Ambulatory Visit: Payer: Self-pay | Admitting: *Deleted

## 2012-04-08 MED ORDER — DIGOXIN 250 MCG PO TABS: 0.2500 mg | ORAL_TABLET | Freq: Every day | ORAL | Status: DC

## 2012-04-21 ENCOUNTER — Other Ambulatory Visit: Payer: Self-pay | Admitting: *Deleted

## 2012-04-21 MED ORDER — EZETIMIBE 10 MG PO TABS: 10.0000 mg | ORAL_TABLET | Freq: Every day | ORAL | Status: DC

## 2012-04-21 MED ORDER — LOSARTAN POTASSIUM-HCTZ 100-12.5 MG PO TABS: 1.0000 | ORAL_TABLET | Freq: Every day | ORAL | Status: DC

## 2012-04-21 NOTE — Telephone Encounter (Signed)
Fax Received. Refill Completed. Rodney Hardy (R.M.A)   

## 2012-06-11 ENCOUNTER — Ambulatory Visit (INDEPENDENT_AMBULATORY_CARE_PROVIDER_SITE_OTHER): Payer: BC Managed Care – PPO | Admitting: Internal Medicine

## 2012-06-11 ENCOUNTER — Ambulatory Visit: Payer: BC Managed Care – PPO

## 2012-06-11 VITALS — BP 124/73 | HR 80 | Temp 98.0°F | Resp 18 | Ht >= 80 in | Wt 250.4 lb

## 2012-06-11 DIAGNOSIS — R05 Cough: Secondary | ICD-10-CM

## 2012-06-11 DIAGNOSIS — R509 Fever, unspecified: Secondary | ICD-10-CM

## 2012-06-11 DIAGNOSIS — J209 Acute bronchitis, unspecified: Secondary | ICD-10-CM

## 2012-06-11 LAB — POCT CBC
HCT, POC: 43.1 % — AB (ref 43.5–53.7)
Hemoglobin: 14.6 g/dL (ref 14.1–18.1)
Lymph, poc: 1.9 (ref 0.6–3.4)
MCH, POC: 32.4 pg — AB (ref 27–31.2)
MCV: 95.6 fL (ref 80–97)
MPV: 7.9 fL (ref 0–99.8)
RBC: 4.51 M/uL — AB (ref 4.69–6.13)
WBC: 5.1 10*3/uL (ref 4.6–10.2)

## 2012-06-11 MED ORDER — LEVOFLOXACIN 500 MG PO TABS: 500.0000 mg | ORAL_TABLET | Freq: Every day | ORAL | Status: DC

## 2012-06-11 MED ORDER — HYDROCODONE-ACETAMINOPHEN 7.5-325 MG/15ML PO SOLN: 5.0000 mL | Freq: Four times a day (QID) | ORAL | Status: DC | PRN

## 2012-06-11 MED ORDER — ALBUTEROL SULFATE HFA 108 (90 BASE) MCG/ACT IN AERS: 2.0000 | INHALATION_SPRAY | Freq: Four times a day (QID) | RESPIRATORY_TRACT | Status: DC | PRN

## 2012-06-11 NOTE — Progress Notes (Signed)
  Subjective:    Patient ID: Rodney Hardy, male    DOB: Jan 31, 1951, 62 y.o.   MRN: [id number]  HPI 1 week of fever, cough, wheezing, loose stools. Is better but wife dx with pneumonia. Some fatigue, less nite sweats, is improving.   Review of Systems Problems and meds reviewed    Objective:   Physical Exam  Vitals reviewed. Constitutional: He is oriented to person, place, and time. He appears well-developed and well-nourished. He appears distressed.  Eyes: EOM are normal.  Neck: Normal range of motion.  Cardiovascular: Normal rate, regular rhythm and normal heart sounds.   Pulmonary/Chest: Effort normal. No accessory muscle usage. Not tachypneic. No respiratory distress. He has decreased breath sounds. He has wheezes. He has rhonchi. He has no rales.  Lymphadenopathy:    He has no cervical adenopathy.  Neurological: He is alert and oriented to person, place, and time. He exhibits normal muscle tone. Coordination normal.  Skin: He is diaphoretic.  Psychiatric: He has a normal mood and affect.   [facility] reading (PRIMARY) by  Dr Perrin Maltese no infiltate Results for orders placed in visit on 06/11/12  POCT CBC      Result Value Range   WBC 5.1  4.6 - 10.2 K/uL   Lymph, poc 1.9  0.6 - 3.4   POC LYMPH PERCENT 37.8  10 - 50 %L   MID (cbc) 0.4  0 - 0.9   POC MID % 7.4  0 - 12 %M   POC Granulocyte 2.8  2 - 6.9   Granulocyte percent 54.8  37 - 80 %G   RBC 4.51 (*) 4.69 - 6.13 M/uL   Hemoglobin 14.6  14.1 - 18.1 g/dL   HCT, POC 78.2 (*) 95.6 - 53.7 %   MCV 95.6  80 - 97 fL   MCH, POC 32.4 (*) 27 - 31.2 pg   MCHC 33.9  31.8 - 35.4 g/dL   RDW, POC 21.3     Platelet Count, POC 287  142 - 424 K/uL   MPV 7.9  0 - 99.8 fL            Assessment & Plan:  Bronchitis/bronchospasm Zithromax/Lortab/Albuterol hfa

## 2012-06-11 NOTE — Patient Instructions (Signed)

## 2012-06-29 ENCOUNTER — Encounter: Payer: Self-pay | Admitting: Internal Medicine

## 2012-07-08 ENCOUNTER — Other Ambulatory Visit: Payer: Self-pay | Admitting: *Deleted

## 2012-07-08 MED ORDER — ATORVASTATIN CALCIUM 20 MG PO TABS: 20.0000 mg | ORAL_TABLET | Freq: Every day | ORAL | Status: DC

## 2012-07-08 NOTE — Telephone Encounter (Signed)
Fax Received. Refill Completed. Rodney Hardy (R.M.A)   

## 2012-07-26 ENCOUNTER — Encounter (INDEPENDENT_AMBULATORY_CARE_PROVIDER_SITE_OTHER): Payer: BC Managed Care – PPO

## 2012-07-26 DIAGNOSIS — I714 Abdominal aortic aneurysm, without rupture: Secondary | ICD-10-CM

## 2012-08-05 ENCOUNTER — Ambulatory Visit (AMBULATORY_SURGERY_CENTER): Payer: BC Managed Care – PPO | Admitting: *Deleted

## 2012-08-05 ENCOUNTER — Encounter: Payer: Self-pay | Admitting: Internal Medicine

## 2012-08-05 VITALS — Ht >= 80 in | Wt 253.0 lb

## 2012-08-05 DIAGNOSIS — Z1211 Encounter for screening for malignant neoplasm of colon: Secondary | ICD-10-CM

## 2012-08-05 MED ORDER — MOVIPREP 100 G PO SOLR: ORAL | Status: DC

## 2012-08-11 ENCOUNTER — Ambulatory Visit (INDEPENDENT_AMBULATORY_CARE_PROVIDER_SITE_OTHER): Payer: BC Managed Care – PPO | Admitting: Cardiovascular Disease

## 2012-08-11 ENCOUNTER — Encounter: Payer: Self-pay | Admitting: Cardiovascular Disease

## 2012-08-11 VITALS — BP 139/82 | HR 72 | Ht >= 80 in | Wt 248.0 lb

## 2012-08-11 DIAGNOSIS — E785 Hyperlipidemia, unspecified: Secondary | ICD-10-CM

## 2012-08-11 DIAGNOSIS — I472 Ventricular tachycardia: Secondary | ICD-10-CM

## 2012-08-11 NOTE — Assessment & Plan Note (Signed)
Rodney Hardy is doing well.  He has not had any further episodes of ventricular tachycardia.   He denies any chest pain or dyspnea.  Will continue with current meds.  I will see him in 1 year.

## 2012-08-11 NOTE — Progress Notes (Signed)
Rodney Hardy Date of Birth  [birth year]  [facility] [address]    [address] [place], [place]  [address] [phone]  Fax  [phone]   Problem List: 1. HTN 2. Ventricular Tachycardia 3. Abdominal aortic aneurism  History of Present Illness:  62 YO with a history of PAF, HTN, hyperlipidemia.   Rodney Hardy is a 62 year old gentleman with a history of ventricular tachycardia. He was hospitalized in may of this year and a cardiac catheterization revealed no significant coronary artery disease. His ventricular tachycardia has been well-controlled on flecainide and atenolol.  Had a dog bite accident recently. He was playing catch with his dog and the dog accidentally bit him on the hand. He's recovering quite nicely from that.  He is well controlled on Atenolol and flecainide.  He was found to have an  AAA that has enlarged slightly.  Aug 11, 2012:  Rodney Hardy is doing great.  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  Exercising occasionally.  No CP or dypnea.   Current Outpatient Prescriptions on File Prior to Visit  Medication Sig Dispense Refill  . albuterol (PROVENTIL HFA;VENTOLIN HFA) 108 (90 BASE) MCG/ACT inhaler Inhale 2 puffs into the lungs every 6 (six) hours as needed for wheezing.  1 Inhaler  0  . Ascorbic Acid (VITAMIN C) 500 MG tablet Take 500 mg by mouth daily.        Marland Kitchen aspirin 325 MG tablet Take 325 mg by mouth daily.      Marland Kitchen atenolol (TENORMIN) 50 MG tablet Take 1 tablet (50 mg total) by mouth daily.  30 tablet  5  . atorvastatin (LIPITOR) 20 MG tablet Take 1 tablet (20 mg total) by mouth daily.  30 tablet  5  . Cholecalciferol (VITAMIN D) 1000 UNITS capsule Take 1,000 Units by mouth daily.        . digoxin (LANOXIN) 0.25 MG tablet Take 1 tablet (0.25 mg total) by mouth daily.  30 tablet  6  . ezetimibe (ZETIA) 10 MG tablet Take 1 tablet (10 mg total) by mouth daily.  30 tablet  6  . flecainide (TAMBOCOR) 100 MG tablet take 1 tablet by mouth twice a day  60  tablet  6  . losartan-hydrochlorothiazide (HYZAAR) 100-12.5 MG per tablet Take 1 tablet by mouth daily.  90 tablet  3  . MOVIPREP 100 G SOLR moviprep as directed. No substitutions  1 kit  0  . Multiple Vitamin (MULTIVITAMIN) tablet Take 1 tablet by mouth daily.        . vitamin E 400 UNIT capsule Take 400 Units by mouth daily.         No current facility-administered medications on file prior to visit.    Allergies  Allergen Reactions  . Erythromycin Other (See Comments)    unknown  . Lisinopril Cough  . Procaine Hcl Other (See Comments)    Pt's heart races.    Past Medical History  Diagnosis Date  . Hyperlipidemia 1995  . PAF (paroxysmal atrial fibrillation)   . Hypertensive heart disease   . Abdominal aortic aneurysm   . Migraine   . Joint pain   . Hypertension     Past Surgical History  Procedure Laterality Date  . Other surgical history  2002    Bone Spur tooken off left elbow  . Cardiac catheterization  2013    Normal coronary arteries  . Tonsillectomy  1955    History  Smoking status  . Former Smoker -- 2.00 packs/day for  20 years  . Types: Cigarettes  . Quit date: 04/13/1969  Smokeless tobacco  . Never Used    History  Alcohol Use  . 9.0 oz/week  . 15 Cans of beer per week    Family History  Problem Relation Age of Onset  . Lymphoma Father 59  . Arrhythmia Sister   . Cancer Neg Hx   . Alcohol abuse Neg Hx   . Arthritis Neg Hx   . Heart disease Neg Hx   . Hyperlipidemia Neg Hx   . Hypertension Neg Hx   . Stroke Neg Hx   . Colon cancer Neg Hx     Reviw of Systems:  Reviewed in the HPI.  All other systems are negative.  Physical Exam: BP 139/82  Pulse 72  Ht 6\' 8"  (2.032 m)  Wt 248 lb (112.492 kg)  BMI 27.24 kg/m2 The patient is alert and oriented x 3.  The mood and affect are normal.   Skin: warm and dry.  Color is normal.    HEENT:   the sclera are nonicteric.  The mucous membranes are moist.  The carotids are 2+ without bruits.   There is no thyromegaly.  There is no JVD.    Lungs: clear.  The chest wall is non tender.    Heart: regular rate with a normal S1 and S2.  There are no murmurs, gallops, or rubs. The PMI is not displaced.     Abdomen: good bowel sounds.  There is no guarding or rebound.  There is no hepatosplenomegaly or tenderness.  There are no masses.   Extremities:  no clubbing, cyanosis, or edema.  The legs are without rashes.  The distal pulses are intact.   Neuro:  Cranial nerves II - XII are intact.  Motor and sensory functions are intact.    The gait is normal.  ECG: 01/31/2012-normal sinus rhythm at 60 beats a minute. He has nonspecific IVCD.   Assessment / Plan:

## 2012-08-11 NOTE — Assessment & Plan Note (Signed)
Will check fasting labs in 1 year.  

## 2012-08-11 NOTE — Patient Instructions (Addendum)
Your physician wants you to follow-up in: 1 year  You will receive a reminder letter in the mail two months in advance. If you don't receive a letter, please call our office to schedule the follow-up appointment.   Your physician recommends that you return for a FASTING lipid profile: 1 year   

## 2012-08-19 ENCOUNTER — Encounter: Payer: Self-pay | Admitting: Internal Medicine

## 2012-08-19 ENCOUNTER — Ambulatory Visit (AMBULATORY_SURGERY_CENTER): Payer: BC Managed Care – PPO | Admitting: Internal Medicine

## 2012-08-19 VITALS — BP 128/78 | HR 57 | Temp 97.4°F | Resp 17 | Ht >= 80 in | Wt 253.0 lb

## 2012-08-19 DIAGNOSIS — Z1211 Encounter for screening for malignant neoplasm of colon: Secondary | ICD-10-CM

## 2012-08-19 DIAGNOSIS — D126 Benign neoplasm of colon, unspecified: Secondary | ICD-10-CM

## 2012-08-19 MED ORDER — SODIUM CHLORIDE 0.9 % IV SOLN: 500.0000 mL | INTRAVENOUS | Status: DC

## 2012-08-19 NOTE — Op Note (Addendum)
[facility] [address] [place], [address]   COLONOSCOPY PROCEDURE REPORT  PATIENT: Rodney, Hardy  MR#: [id number] BIRTHDATE: 11/24/1950 , 61  yrs. old GENDER: Male ENDOSCOPIST: Beverley Fiedler, MD REFERRED XB:[id number] Karsten Ro, M.D. PROCEDURE DATE:  08/19/2012 PROCEDURE:   Colonoscopy with snare polypectomy ASA CLASS:   Class III INDICATIONS:average risk screening and first colonoscopy. MEDICATIONS: MAC sedation, administered by CRNA and Propofol (Diprivan) 550 mg IV  DESCRIPTION OF PROCEDURE:   After the risks benefits and alternatives of the procedure were thoroughly explained, informed consent was obtained.  A digital rectal exam revealed no rectal mass.   The LB [id number]-[id number]  endoscope was introduced through the anus and advanced to the cecum, which was identified by both the appendix and ileocecal valve. No adverse events experienced. The quality of the prep was good, using MoviPrep  The instrument was then slowly withdrawn as the colon was fully examined.   COLON FINDINGS: Two sessile polyps measuring 5 mm in size were found in the ascending colon and sigmoid colon.  Polypectomy was performed using cold snare.  All resections were complete and all polyp tissue was completely retrieved.   The colon mucosa was otherwise normal.  Retroflexed views revealed no abnormalities. The time to cecum=7 minutes 45 seconds.  Withdrawal time=14 minutes 50 seconds.  The scope was withdrawn and the procedure completed.  COMPLICATIONS: There were no complications.  ENDOSCOPIC IMPRESSION: 1.   Two sessile polyps measuring 5 mm in size were found in the ascending colon and sigmoid colon; Polypectomy was performed using cold snare 2.   The colon mucosa was otherwise normal  RECOMMENDATIONS: 1.  Await pathology results 2.  If the polyps removed today are proven to be adenomatous (pre-cancerous) polyps, you will need a repeat colonoscopy in 5 years.   Otherwise you should continue to follow colorectal cancer screening guidelines for "routine risk" patients with colonoscopy in 10 years.  You will receive a letter within 1-2 weeks with the results of your biopsy as well as final recommendations.  Please call my office if you have not received a letter after 3 weeks.   eSigned:  Beverley Fiedler, MD 08/19/2012 9:50 AM  cc: The Patient

## 2012-08-19 NOTE — Patient Instructions (Addendum)
YOU HAD AN ENDOSCOPIC PROCEDURE TODAY AT THE [facility]: Refer to the procedure report that was given to you for any specific questions about what was found during the examination.  If the procedure report does not answer your questions, please call your gastroenterologist to clarify.  If you requested that your care partner not be given the details of your procedure findings, then the procedure report has been included in a sealed envelope for you to review at your convenience later.  YOU SHOULD EXPECT: Some feelings of bloating in the abdomen. Passage of more gas than usual.  Walking can help get rid of the air that was put into your GI tract during the procedure and reduce the bloating. If you had a lower endoscopy (such as a colonoscopy or flexible sigmoidoscopy) you may notice spotting of blood in your stool or on the toilet paper. If you underwent a bowel prep for your procedure, then you may not have a normal bowel movement for a few days.  DIET: Your first meal following the procedure should be a light meal and then it is ok to progress to your normal diet.  A half-sandwich or bowl of soup is an example of a good first meal.  Heavy or fried foods are harder to digest and may make you feel nauseous or bloated.  Likewise meals heavy in dairy and vegetables can cause extra gas to form and this can also increase the bloating.  Drink plenty of fluids but you should avoid alcoholic beverages for 24 hours.  ACTIVITY: Your care partner should take you home directly after the procedure.  You should plan to take it easy, moving slowly for the rest of the day.  You can resume normal activity the day after the procedure however you should NOT DRIVE or use heavy machinery for 24 hours (because of the sedation medicines used during the test).    SYMPTOMS TO REPORT IMMEDIATELY: A gastroenterologist can be reached at any hour.  During normal business hours, 8:30 AM to 5:00 PM Monday through Friday,  call [phone].  After hours and on weekends, please call the GI answering service at [phone] who will take a message and have the physician on call contact you.   Following lower endoscopy (colonoscopy or flexible sigmoidoscopy):  Excessive amounts of blood in the stool  Significant tenderness or worsening of abdominal pains  Swelling of the abdomen that is new, acute  Fever of 100F or higher  FOLLOW UP: If any biopsies were taken you will be contacted by phone or by letter within the next 1-3 weeks.  Call your gastroenterologist if you have not heard about the biopsies in 3 weeks.  Our staff will call the home number listed on your records the next business day following your procedure to check on you and address any questions or concerns that you may have at that time regarding the information given to you following your procedure. This is a courtesy call and so if there is no answer at the home number and we have not heard from you through the emergency physician on call, we will assume that you have returned to your regular daily activities without incident.  SIGNATURES/CONFIDENTIALITY: You and/or your care partner have signed paperwork which will be entered into your electronic medical record.  These signatures attest to the fact that that the information above on your After Visit Summary has been reviewed and is understood.  Full responsibility of the confidentiality of this   discharge information lies with you and/or your care-partner.  Polyps-handout given  Repeat colonoscopy will be determined by pathology   

## 2012-08-19 NOTE — Progress Notes (Signed)
Patient did not experience any of the following events: a burn prior to discharge; a fall within the facility; wrong site/side/patient/procedure/implant event; or a hospital transfer or hospital admission upon discharge from the facility. (G8907) Patient did not have preoperative order for IV antibiotic SSI prophylaxis. (G8918)  

## 2012-08-19 NOTE — Progress Notes (Signed)
Called to room to assist during endoscopic procedure.  Patient ID and intended procedure confirmed with present staff. Received instructions for my participation in the procedure from the performing physician.  

## 2012-08-22 ENCOUNTER — Telehealth: Payer: Self-pay | Admitting: *Deleted

## 2012-08-22 NOTE — Telephone Encounter (Signed)
  Follow up Call-  Call back number 08/19/2012  Post procedure Call Back phone  # [phone]  Permission to leave phone message Yes     Patient questions:  Do you have a fever, pain , or abdominal swelling? no Pain Score  0 *  Have you tolerated food without any problems? yes  Have you been able to return to your normal activities? yes  Do you have any questions about your discharge instructions: Diet   no Medications  no Follow up visit  no  Do you have questions or concerns about your Care? no  Actions: * If pain score is 4 or above: No action needed, pain <4.

## 2012-08-24 ENCOUNTER — Encounter: Payer: Self-pay | Admitting: Internal Medicine

## 2012-09-08 ENCOUNTER — Other Ambulatory Visit: Payer: Self-pay | Admitting: *Deleted

## 2012-09-08 MED ORDER — ATENOLOL 50 MG PO TABS: 50.0000 mg | ORAL_TABLET | Freq: Every day | ORAL | Status: DC

## 2012-09-08 NOTE — Telephone Encounter (Signed)
Fax Received. Refill Completed. Jerrye Seebeck Chowoe (R.M.A)   

## 2012-09-30 ENCOUNTER — Telehealth: Payer: Self-pay

## 2012-09-30 NOTE — Telephone Encounter (Signed)
I got a refill request for Flecainide for Rodney Hardy and it was last refilled by Dr. Ladona Ridgel but the pt has been Discharged from Dr. Ladona Ridgel and now sees Dr. Elease Hashimoto. Will Dr. Elease Hashimoto refill this? I called the pt since its late Friday and he verified he has 30 pills still left.

## 2012-10-03 ENCOUNTER — Other Ambulatory Visit: Payer: Self-pay | Admitting: *Deleted

## 2012-10-03 MED ORDER — FLECAINIDE ACETATE 100 MG PO TABS: ORAL_TABLET | ORAL | Status: DC

## 2012-10-03 NOTE — Telephone Encounter (Signed)
Refill completed.

## 2012-11-03 ENCOUNTER — Other Ambulatory Visit: Payer: Self-pay | Admitting: *Deleted

## 2012-11-03 MED ORDER — DIGOXIN 250 MCG PO TABS: 0.2500 mg | ORAL_TABLET | Freq: Every day | ORAL | Status: DC

## 2012-11-17 ENCOUNTER — Other Ambulatory Visit: Payer: Self-pay | Admitting: *Deleted

## 2012-11-17 MED ORDER — EZETIMIBE 10 MG PO TABS: 10.0000 mg | ORAL_TABLET | Freq: Every day | ORAL | Status: DC

## 2012-11-17 NOTE — Telephone Encounter (Signed)
Fax Received. Refill Completed. Rodney Hardy (R.M.A)   

## 2012-12-07 ENCOUNTER — Ambulatory Visit: Payer: BC Managed Care – PPO

## 2012-12-07 ENCOUNTER — Encounter: Payer: Self-pay | Admitting: Cardiology

## 2012-12-07 ENCOUNTER — Ambulatory Visit (INDEPENDENT_AMBULATORY_CARE_PROVIDER_SITE_OTHER): Payer: BC Managed Care – PPO | Admitting: Family Medicine

## 2012-12-07 VITALS — BP 112/60 | HR 63 | Temp 98.2°F | Resp 16 | Ht >= 80 in | Wt 246.2 lb

## 2012-12-07 DIAGNOSIS — M79642 Pain in left hand: Secondary | ICD-10-CM

## 2012-12-07 DIAGNOSIS — M79609 Pain in unspecified limb: Secondary | ICD-10-CM

## 2012-12-07 DIAGNOSIS — T148XXA Other injury of unspecified body region, initial encounter: Secondary | ICD-10-CM

## 2012-12-07 NOTE — Progress Notes (Signed)
[facility]:  Office Visit  Chief Complaint:  Chief Complaint  Patient presents with  . Finger Injury    injured left pointer finger yesterday.  now swollen and painful    HPI: Rodney Hardy is a 62 y.o. male who complains of  Left index finger pain x 1 day. He was adjusting a Spring hold down on post, attched to break machine, when he heard a pop in his left index finger. He is able to wiggle his finger, he has a sharp pain in his index finger, and has throbbing pain in hand. No prior injuriesTo the hand or finger requiring surgeries. Deneis osteoporosis or osteopenia. Deneis numbness, weakness tingling. He has used a finger splint he had and iced it. 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 He is ambidexterous  Past Medical History  Diagnosis Date  . Hyperlipidemia 1995  . PAF (paroxysmal atrial fibrillation)   . Hypertensive heart disease   . Abdominal aortic aneurysm   . Migraine   . Joint pain   . Hypertension   . Arthritis    Past Surgical History  Procedure Laterality Date  . Other surgical history  2002    Bone Spur tooken off left elbow  . Cardiac catheterization  2013    Normal coronary arteries  . Tonsillectomy  1955  . Bone chip removal      left elbow   History   Social History  . Marital Status: Married    Spouse Name: N/A    Number of Children: N/A  . Years of Education: N/A   Social History Main Topics  . Smoking status: Former Smoker -- 2.00 packs/day for 20 years    Types: Cigarettes    Quit date: 04/13/1969  . Smokeless tobacco: Never Used  . Alcohol Use: 9.0 oz/week    15 Cans of beer per week  . Drug Use: No  . Sexual Activity: Yes   Other Topics Concern  . None   Social History Narrative  . None   Family History  Problem Relation Age of Onset  . Lymphoma Father 76  . Arrhythmia Sister   . Cancer Neg Hx   . Alcohol abuse Neg Hx   . Arthritis Neg Hx   . Heart disease Neg  Hx   . Hyperlipidemia Neg Hx   . Hypertension Neg Hx   . Stroke Neg Hx   . Colon cancer Neg Hx   . Esophageal cancer Neg Hx   . Stomach cancer Neg Hx   . Rectal cancer Neg Hx    Allergies  Allergen Reactions  . Erythromycin Other (See Comments)    unknown  . Lisinopril Cough  . Procaine Hcl Other (See Comments)    Pt's heart races.   Prior to Admission medications   Medication Sig Start Date End Date Taking? Authorizing Provider  Ascorbic Acid (VITAMIN C) 500 MG tablet Take 500 mg by mouth daily.     Yes Historical Provider, MD  aspirin 325 MG tablet Take 325 mg by mouth daily.   Yes Historical Provider, MD  atenolol (TENORMIN) 50 MG tablet Take 1 tablet (50 mg total) by mouth daily. 09/08/12 09/08/13 Yes Vesta Mixer, MD  atorvastatin (LIPITOR) 20 MG tablet Take 1 tablet (20 mg total) by mouth daily. 07/08/12  Yes Vesta Mixer, MD  Cholecalciferol (VITAMIN D) 1000 UNITS capsule Take 1,000 Units by mouth daily.     Yes  Historical Provider, MD  digoxin (LANOXIN) 0.25 MG tablet Take 1 tablet (0.25 mg total) by mouth daily. 11/03/12  Yes Vesta Mixer, MD  ezetimibe (ZETIA) 10 MG tablet Take 1 tablet (10 mg total) by mouth daily. 11/17/12  Yes Vesta Mixer, MD  flecainide [facility]) 100 MG tablet take 1 tablet by mouth twice a day 10/03/12  Yes Vesta Mixer, MD  losartan-hydrochlorothiazide [facility]) 100-12.5 MG per tablet Take 1 tablet by mouth daily. 04/21/12  Yes Vesta Mixer, MD  Multiple Vitamin (MULTIVITAMIN) tablet Take 1 tablet by mouth daily.     Yes Historical Provider, MD  vitamin E 400 UNIT capsule Take 400 Units by mouth daily.     Yes Historical Provider, MD  albuterol (PROVENTIL HFA;VENTOLIN HFA) 108 (90 BASE) MCG/ACT inhaler Inhale 2 puffs into the lungs every 6 (six) hours as needed for wheezing. 06/11/12   Jonita Albee, MD     ROS: The patient denies fevers, chills, night sweats, unintentional weight loss, chest pain, palpitations, wheezing, dyspnea on exertion,  nausea, vomiting, abdominal pain, dysuria, hematuria, melena, numbness, weakness, or tingling.   All other systems have been reviewed and were otherwise negative with the exception of those mentioned in the HPI and as above.    PHYSICAL EXAM: Filed Vitals:   12/07/12 0905  BP: 112/60  Pulse: 63  Temp: 98.2 F (36.8 C)  Resp: 16   Filed Vitals:   12/07/12 0905  Height: 6\' 8"  (2.032 m)  Weight: 246 lb 3.2 oz (111.676 kg)   Body mass index is 27.05 kg/(m^2).  General: Alert, no acute distress HEENT:  Normocephalic, atraumatic, oropharynx patent. EOMI, PERRLA Cardiovascular:  Regular rate and rhythm, no rubs murmurs or gallops.  No Carotid bruits, radial pulse intact. No pedal edema.  Respiratory: Clear to auscultation bilaterally.  No wheezes, rales, or rhonchi.  No cyanosis, no use of accessory musculature GI: No organomegaly, abdomen is soft and non-tender, positive bowel sounds.  No masses. Skin: No rashes. Neurologic: Facial musculature symmetric. Psychiatric: Patient is appropriate throughout our interaction. Lymphatic: No cervical lymphadenopathy Musculoskeletal: Gait intact. Left hand-base of MCP swelling, pain index finger Tender at extensor tend of PIP and only on extensor tendon none on collateral ligaments No tenderness on volar aspect 5/5 strength, sensation intact   LABS: Results for orders placed in visit on 06/11/12  POCT CBC      Result Value Range   WBC 5.1  4.6 - 10.2 K/uL   Lymph, poc 1.9  0.6 - 3.4   POC LYMPH PERCENT 37.8  10 - 50 %L   MID (cbc) 0.4  0 - 0.9   POC MID % 7.4  0 - 12 %M   POC Granulocyte 2.8  2 - 6.9   Granulocyte percent 54.8  37 - 80 %G   RBC 4.51 (*) 4.69 - 6.13 M/uL   Hemoglobin 14.6  14.1 - 18.1 g/dL   HCT, POC 16.1 (*) 09.6 - 53.7 %   MCV 95.6  80 - 97 fL   MCH, POC 32.4 (*) 27 - 31.2 pg   MCHC 33.9  31.8 - 35.4 g/dL   RDW, POC 04.5     Platelet Count, POC 287  142 - 424 K/uL   MPV 7.9  0 - 99.8 fL  HM COLONOSCOPY       Result Value Range   HM Colonoscopy tubular adenoma       EKG/XRAY:   Primary read interpreted by Dr. Conley Rolls  at [facility]. No fx/dislocation   ASSESSMENT/PLAN: Encounter Diagnoses  Name Primary?  . Left hand pain Yes  . Sprain and strain    ? Extensor tendon spra Splint in neutral position and buddy tape C/w Ibuprofen and RICE F/u in 1 week, if no improvement then refer to hand center Gross sideeffects, risk and benefits, and alternatives of medications d/w patient. Patient is aware that all medications have potential sideeffects and we are unable to predict every sideeffect or drug-drug interaction that may occur.  Yer Olivencia PHUONG, DO 12/07/2012 10:04 AM

## 2013-01-03 ENCOUNTER — Other Ambulatory Visit: Payer: Self-pay | Admitting: *Deleted

## 2013-01-03 MED ORDER — ATORVASTATIN CALCIUM 20 MG PO TABS: 20.0000 mg | ORAL_TABLET | Freq: Every day | ORAL | Status: DC

## 2013-01-31 ENCOUNTER — Ambulatory Visit (HOSPITAL_COMMUNITY): Payer: BC Managed Care – PPO | Attending: Internal Medicine

## 2013-01-31 DIAGNOSIS — I723 Aneurysm of iliac artery: Secondary | ICD-10-CM

## 2013-01-31 DIAGNOSIS — I714 Abdominal aortic aneurysm, without rupture, unspecified: Secondary | ICD-10-CM | POA: Insufficient documentation

## 2013-01-31 DIAGNOSIS — I10 Essential (primary) hypertension: Secondary | ICD-10-CM | POA: Insufficient documentation

## 2013-01-31 DIAGNOSIS — Z87891 Personal history of nicotine dependence: Secondary | ICD-10-CM | POA: Insufficient documentation

## 2013-01-31 DIAGNOSIS — I7789 Other specified disorders of arteries and arterioles: Secondary | ICD-10-CM | POA: Insufficient documentation

## 2013-01-31 DIAGNOSIS — E785 Hyperlipidemia, unspecified: Secondary | ICD-10-CM | POA: Insufficient documentation

## 2013-02-07 ENCOUNTER — Telehealth: Payer: Self-pay | Admitting: Nurse Practitioner

## 2013-02-07 DIAGNOSIS — I714 Abdominal aortic aneurysm, without rupture: Secondary | ICD-10-CM

## 2013-02-07 NOTE — Telephone Encounter (Signed)
Reviewed results of AAA duplex with patient who verbalized understanding.  I ordered f/u AAA duplex for 6 months and sent message to schedulers for appointment.

## 2013-02-07 NOTE — Telephone Encounter (Signed)
Message copied by Levi Aland on Tue Feb 07, 2013 12:04 PM ------      Message from: Vesta Mixer      Created: Fri Feb 03, 2013  6:20 PM       Moderate - large AAA. Follow up in 6 months with repeat scan       ------

## 2013-02-28 ENCOUNTER — Other Ambulatory Visit: Payer: Self-pay

## 2013-02-28 MED ORDER — ATENOLOL 50 MG PO TABS: 50.0000 mg | ORAL_TABLET | Freq: Every day | ORAL | Status: DC

## 2013-03-20 ENCOUNTER — Other Ambulatory Visit: Payer: Self-pay | Admitting: Dermatology

## 2013-04-25 ENCOUNTER — Other Ambulatory Visit: Payer: Self-pay

## 2013-04-25 MED ORDER — LOSARTAN POTASSIUM-HCTZ 100-12.5 MG PO TABS: 1.0000 | ORAL_TABLET | Freq: Every day | ORAL | Status: DC

## 2013-05-09 ENCOUNTER — Other Ambulatory Visit: Payer: Self-pay

## 2013-05-09 MED ORDER — FLECAINIDE ACETATE 100 MG PO TABS: ORAL_TABLET | ORAL | Status: DC

## 2013-06-12 ENCOUNTER — Other Ambulatory Visit: Payer: Self-pay

## 2013-06-12 MED ORDER — DIGOXIN 250 MCG PO TABS: 0.2500 mg | ORAL_TABLET | Freq: Every day | ORAL | Status: DC

## 2013-06-20 ENCOUNTER — Other Ambulatory Visit: Payer: Self-pay

## 2013-06-20 MED ORDER — EZETIMIBE 10 MG PO TABS: 10.0000 mg | ORAL_TABLET | Freq: Every day | ORAL | Status: DC

## 2013-07-11 ENCOUNTER — Other Ambulatory Visit: Payer: Self-pay

## 2013-07-11 MED ORDER — ATORVASTATIN CALCIUM 20 MG PO TABS: 20.0000 mg | ORAL_TABLET | Freq: Every day | ORAL | Status: DC

## 2013-08-09 ENCOUNTER — Ambulatory Visit (HOSPITAL_COMMUNITY): Payer: BC Managed Care – PPO | Attending: Cardiovascular Disease | Admitting: Cardiology

## 2013-08-09 DIAGNOSIS — I714 Abdominal aortic aneurysm, without rupture, unspecified: Secondary | ICD-10-CM

## 2013-08-09 NOTE — Progress Notes (Signed)
Abdominal aorta duplex complete 

## 2013-08-15 ENCOUNTER — Ambulatory Visit (INDEPENDENT_AMBULATORY_CARE_PROVIDER_SITE_OTHER): Payer: BC Managed Care – PPO | Admitting: Cardiovascular Disease

## 2013-08-15 ENCOUNTER — Encounter: Payer: Self-pay | Admitting: Cardiovascular Disease

## 2013-08-15 VITALS — BP 138/77 | HR 70 | Ht >= 80 in | Wt 246.0 lb

## 2013-08-15 DIAGNOSIS — I472 Ventricular tachycardia, unspecified: Secondary | ICD-10-CM

## 2013-08-15 DIAGNOSIS — I4891 Unspecified atrial fibrillation: Secondary | ICD-10-CM

## 2013-08-15 DIAGNOSIS — I714 Abdominal aortic aneurysm, without rupture, unspecified: Secondary | ICD-10-CM

## 2013-08-15 DIAGNOSIS — I4729 Other ventricular tachycardia: Secondary | ICD-10-CM

## 2013-08-15 DIAGNOSIS — E785 Hyperlipidemia, unspecified: Secondary | ICD-10-CM

## 2013-08-15 NOTE — Assessment & Plan Note (Signed)
He's not had any significant episodes of paroxysmal atrial fibrillation.

## 2013-08-15 NOTE — Assessment & Plan Note (Signed)
He continues on flecainide. He's not had any episodes of tachycardia. He appears to be very stable.

## 2013-08-15 NOTE — Assessment & Plan Note (Signed)
He is a very small abdominal aortic aneurysm. It measures 3.4 x 4.2 which is unchanged. We'll continue to follow.

## 2013-08-15 NOTE — Progress Notes (Signed)
Rodney Hardy Date of Birth  11/30/1950 [facility] [address]    [place], [place]  [address] [phone]  Fax  [phone]   Problem List: 1. HTN 2. Ventricular Tachycardia 3. Abdominal aortic aneurism  History of Present Illness:  63 YO with a history of PAF, HTN, hyperlipidemia.   Rodney Hardy is a 63 year old gentleman with a history of ventricular tachycardia. He was hospitalized in may of this year and a cardiac catheterization revealed no significant coronary artery disease. His ventricular tachycardia has been well-controlled on flecainide and atenolol.  Had a dog bite accident recently. He was playing catch with his dog and the dog accidentally bit him on the hand. He's recovering quite nicely from that.  He is well controlled on Atenolol and flecainide.  He was found to have an  AAA that has enlarged slightly.  Aug 11, 2012:  Echo is doing great.  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  Exercising occasionally.  No CP or dypnea.   Aug 15, 2013:  Walking regularly with his dog in the am.  Still working.  Has a AAA.    ( 3.4 x 4.2)     Current Outpatient Prescriptions on File Prior to Visit  Medication Sig Dispense Refill  . albuterol (PROVENTIL HFA;VENTOLIN HFA) 108 (90 BASE) MCG/ACT inhaler Inhale 2 puffs into the lungs every 6 (six) hours as needed for wheezing.  1 Inhaler  0  . Ascorbic Acid (VITAMIN C) 500 MG tablet Take 500 mg by mouth daily.        Marland Kitchen aspirin 325 MG tablet Take 325 mg by mouth daily.      Marland Kitchen atenolol (TENORMIN) 50 MG tablet Take 1 tablet (50 mg total) by mouth daily.  30 tablet  5  . atorvastatin (LIPITOR) 20 MG tablet Take 1 tablet (20 mg total) by mouth daily.  30 tablet  5  . Cholecalciferol (VITAMIN D) 1000 UNITS capsule Take 1,000 Units by mouth daily.        . digoxin (LANOXIN) 0.25 MG tablet Take 1 tablet (0.25 mg total) by mouth daily.  30 tablet  6  . ezetimibe (ZETIA) 10 MG tablet Take 1 tablet (10 mg total)  by mouth daily.  30 tablet  6  . flecainide (TAMBOCOR) 100 MG tablet take 1 tablet by mouth twice a day  60 tablet  3  . losartan-hydrochlorothiazide (HYZAAR) 100-12.5 MG per tablet Take 1 tablet by mouth daily.  90 tablet  3  . Multiple Vitamin (MULTIVITAMIN) tablet Take 1 tablet by mouth daily.        . vitamin E 400 UNIT capsule Take 400 Units by mouth daily.         No current facility-administered medications on file prior to visit.    Allergies  Allergen Reactions  . Erythromycin Other (See Comments)    unknown  . Lisinopril Cough  . Procaine Hcl Other (See Comments)    Pt's heart races.    Past Medical History  Diagnosis Date  . Hyperlipidemia 1995  . PAF (paroxysmal atrial fibrillation)   . Hypertensive heart disease   . Abdominal aortic aneurysm   . Migraine   . Joint pain   . Hypertension   . Arthritis     Past Surgical History  Procedure Laterality Date  . Other surgical history  2002    Bone Spur tooken off left elbow  . Cardiac catheterization  2013    Normal coronary arteries  .  Tonsillectomy  1955  . Bone chip removal      left elbow    History  Smoking status  . Former Smoker -- 2.00 packs/day for 20 years  . Types: Cigarettes  . Quit date: 04/13/1969  Smokeless tobacco  . Never Used    History  Alcohol Use  . 9.0 oz/week  . 15 Cans of beer per week    Family History  Problem Relation Age of Onset  . Lymphoma Father 10  . Arrhythmia Sister   . Cancer Neg Hx   . Alcohol abuse Neg Hx   . Arthritis Neg Hx   . Heart disease Neg Hx   . Hyperlipidemia Neg Hx   . Hypertension Neg Hx   . Stroke Neg Hx   . Colon cancer Neg Hx   . Esophageal cancer Neg Hx   . Stomach cancer Neg Hx   . Rectal cancer Neg Hx     Reviw of Systems:  Reviewed in the HPI.  All other systems are negative.  Physical Exam: BP 138/77  Pulse 70  Ht 6\' 8"  (2.032 m)  Wt 246 lb (111.585 kg)  BMI 27.02 kg/m2 The patient is alert and oriented x 3.  The mood and  affect are normal.   Skin: warm and dry.  Color is normal.    HEENT:   the sclera are nonicteric.  The mucous membranes are moist.  The carotids are 2+ without bruits.  There is no thyromegaly.  There is no JVD.    Lungs: clear.  The chest wall is non tender.    Heart: regular rate with a normal S1 and S2.  There are no murmurs, gallops, or rubs. The PMI is not displaced.     Abdomen: good bowel sounds.  There is no guarding or rebound.  There is no hepatosplenomegaly or tenderness.  There are no masses.   Extremities:  no clubbing, cyanosis, or edema.  The legs are without rashes.  The distal pulses are intact.   Neuro:  Cranial nerves II - XII are intact.  Motor and sensory functions are intact.    The gait is normal.  ECG: May 5,2015:  NSR at 71, normal ECG   Assessment / Plan:

## 2013-08-15 NOTE — Assessment & Plan Note (Signed)
We will check fasting labs today. I'll see him again in one year we'll check repeat fasting labs at that time.

## 2013-08-15 NOTE — Patient Instructions (Signed)
Lab work today  ( Therapist, music )    Continue same medications    Your physician wants you to follow-up in: 1 year with fasting lab work. You will receive a reminder letter in the mail two months in advance. If you don't receive a letter, please call our office to schedule the follow-up appointment.

## 2013-08-16 LAB — BASIC METABOLIC PANEL
BUN: 16 mg/dL (ref 6–23)
CO2: 29 mEq/L (ref 19–32)
CREATININE: 0.9 mg/dL (ref 0.4–1.5)
Calcium: 9.7 mg/dL (ref 8.4–10.5)
Chloride: 97 mEq/L (ref 96–112)
GFR: 93.78 mL/min (ref >60.00)
GLUCOSE: 82 mg/dL (ref 70–99)
Potassium: 4.7 mEq/L (ref 3.5–5.1)
Sodium: 133 mEq/L — ABNORMAL LOW (ref 135–145)

## 2013-08-16 LAB — LIPID PANEL
CHOL/HDL RATIO: 2
Cholesterol: 164 mg/dL (ref 0–200)
HDL: 78.5 mg/dL (ref >39.00)
LDL Cholesterol: 59 mg/dL (ref 0–99)
Triglycerides: 132 mg/dL (ref 0.0–149.0)
VLDL: 26.4 mg/dL (ref 0.0–40.0)

## 2013-08-16 LAB — HEPATIC FUNCTION PANEL
ALBUMIN: 4.4 g/dL (ref 3.5–5.2)
ALT: 29 U/L (ref 0–53)
AST: 34 U/L (ref 0–37)
Alkaline Phosphatase: 58 U/L (ref 39–117)
Bilirubin, Direct: 0.1 mg/dL (ref 0.0–0.3)
TOTAL PROTEIN: 7.3 g/dL (ref 6.0–8.3)
Total Bilirubin: 0.7 mg/dL (ref 0.2–1.2)

## 2013-09-13 ENCOUNTER — Other Ambulatory Visit: Payer: Self-pay

## 2013-09-13 MED ORDER — ATENOLOL 50 MG PO TABS: 50.0000 mg | ORAL_TABLET | Freq: Every day | ORAL | Status: DC

## 2013-09-13 MED ORDER — FLECAINIDE ACETATE 100 MG PO TABS: ORAL_TABLET | ORAL | Status: DC

## 2014-01-01 ENCOUNTER — Other Ambulatory Visit: Payer: Self-pay

## 2014-01-01 MED ORDER — DIGOXIN 250 MCG PO TABS: 0.2500 mg | ORAL_TABLET | Freq: Every day | ORAL | Status: DC

## 2014-01-16 ENCOUNTER — Other Ambulatory Visit: Payer: Self-pay | Admitting: *Deleted

## 2014-01-16 MED ORDER — ATORVASTATIN CALCIUM 20 MG PO TABS: 20.0000 mg | ORAL_TABLET | Freq: Every day | ORAL | Status: DC

## 2014-01-16 MED ORDER — EZETIMIBE 10 MG PO TABS: 10.0000 mg | ORAL_TABLET | Freq: Every day | ORAL | Status: DC

## 2014-02-26 ENCOUNTER — Other Ambulatory Visit: Payer: Self-pay | Admitting: *Deleted

## 2014-02-26 MED ORDER — ATENOLOL 50 MG PO TABS: 50.0000 mg | ORAL_TABLET | Freq: Every day | ORAL | Status: DC

## 2014-04-11 ENCOUNTER — Other Ambulatory Visit: Payer: Self-pay | Admitting: *Deleted

## 2014-04-11 MED ORDER — FLECAINIDE ACETATE 100 MG PO TABS: ORAL_TABLET | ORAL | Status: DC

## 2014-04-18 ENCOUNTER — Other Ambulatory Visit: Payer: Self-pay

## 2014-04-18 MED ORDER — LOSARTAN POTASSIUM-HCTZ 100-12.5 MG PO TABS: 1.0000 | ORAL_TABLET | Freq: Every day | ORAL | Status: DC

## 2014-04-23 ENCOUNTER — Telehealth: Payer: Self-pay | Admitting: Cardiovascular Disease

## 2014-04-23 DIAGNOSIS — I714 Abdominal aortic aneurysm, without rupture, unspecified: Secondary | ICD-10-CM

## 2014-04-23 MED ORDER — EZETIMIBE 10 MG PO TABS: 10.0000 mg | ORAL_TABLET | Freq: Every day | ORAL | Status: AC

## 2014-04-23 MED ORDER — LOSARTAN POTASSIUM-HCTZ 100-12.5 MG PO TABS: 1.0000 | ORAL_TABLET | Freq: Every day | ORAL | Status: AC

## 2014-04-23 MED ORDER — ATENOLOL 50 MG PO TABS: 50.0000 mg | ORAL_TABLET | Freq: Every day | ORAL | Status: AC

## 2014-04-23 MED ORDER — FLECAINIDE ACETATE 100 MG PO TABS: ORAL_TABLET | ORAL | Status: AC

## 2014-04-23 MED ORDER — DIGOXIN 250 MCG PO TABS: 0.2500 mg | ORAL_TABLET | Freq: Every day | ORAL | Status: DC

## 2014-04-23 NOTE — Telephone Encounter (Signed)
New message      Pt states he has not received a notice to schedule his abd aorta ultrasound.  He has one every 6 months----the doctor is watching something.  Is he due to have one soon?  Please call

## 2014-04-23 NOTE — Telephone Encounter (Signed)
Spoke with patient and advised that AAA duplex is due in May 2016 followed by office visit with Dr. Acie Fredrickson.  Patient states these scans used to be done every 6 months; I advised that due to last duplex being stable and AAA remains small, Dr. Acie Fredrickson ordered repeat in 1 year.  Patient verbalized understanding and agreement.  Patient also advised that he has been having difficulty getting his Rx's refilled.  I reviewed patient's chart and advised that I would refill medications for 1 year supply due to patient follows-up on a regular basis and does not need to have different expiration dates for every medication.  Patient verbalized understanding and gratitude.

## 2014-04-27 ENCOUNTER — Encounter: Payer: Self-pay | Admitting: Internal Medicine

## 2014-05-03 ENCOUNTER — Telehealth: Payer: Self-pay | Admitting: Nurse Practitioner

## 2014-05-03 NOTE — Telephone Encounter (Signed)
Received notification from [employer] that they will no longer cover medication Digitek.  Dr. Acie Fredrickson reviewed the patient's medications and advised that patient may discontinue Digitek.  I called patient and left a detailed message on his personal voice mail.  I advised patient he may call back with questions or concerns.

## 2014-05-29 ENCOUNTER — Telehealth: Payer: Self-pay | Admitting: *Deleted

## 2014-05-29 NOTE — Telephone Encounter (Signed)
[employer] Part D [phone]). Initiated Prior Authorization for Digitek 250 mcg tablet - 1 tablet by mouth daily. Reference #: [id number]. [facility] is faxing PA Request form to be completed and faxed back to them. Once rec'd, they can be followed up for decision within 24-72 hours. Form rec'd 05/29/14 @ 12:05 pm and placed in MD box.

## 2014-07-13 ENCOUNTER — Other Ambulatory Visit: Payer: Self-pay

## 2014-07-13 MED ORDER — ATORVASTATIN CALCIUM 20 MG PO TABS: 20.0000 mg | ORAL_TABLET | Freq: Every day | ORAL | Status: DC

## 2014-07-23 ENCOUNTER — Telehealth: Payer: Self-pay | Admitting: Cardiovascular Disease

## 2014-07-23 MED ORDER — PROPRANOLOL HCL 10 MG PO TABS: 10.0000 mg | ORAL_TABLET | Freq: Four times a day (QID) | ORAL | Status: DC

## 2014-07-23 NOTE — Telephone Encounter (Signed)
Spoke with patient who states heart rate felt irregular beginning Thursday, intermittently through Saturday; states felt like heart was skipping beats. Patient did not monitor heart rate readings; states BP was 863 systolic and 81'R diastolic;  States BP normal today; HR Regular at 66 bpm.  States had Propranolol in house that was prescribed previously which he took - states it did help some Patient states since stopping Digitek in January due to insurance will no longer cover, he has noticed more episodes of heart skipping beats but this episode was the worst. I advised patient that I will notify Dr. Acie Fredrickson and will call him back with his advice.  Patient verbalized understanding and agreement.

## 2014-07-23 NOTE — Telephone Encounter (Signed)
He may benefit from additional beta blocker. He may either increase atenolol to 75 mg a day or take propranolol 10 mg QID as needed.

## 2014-07-23 NOTE — Telephone Encounter (Signed)
New Message  Pt wanted to speak w/ Rn about recent arrhythmia after stopping digitec; any alternatives. Please call back and discuss.

## 2014-07-23 NOTE — Telephone Encounter (Signed)
Spoke with patient and reviewed Dr. Elmarie Shiley advice.  Patient reports that today he was a little dizzy and BP was 105/55, HR 52 and then 125/75, HR 62.  Patient states he is aware that if he is having irregular heart beat that some of those beats would not be counted in his pulse reading.  I advised that since patient has relatively low BP and heart rate, that the Propranolol would probably be the better option for him to take only when needed.    He has follow-up appointment with Dr. Acie Fredrickson for 5/10.  I advised patient to call back with worsening symptoms or if BP or heart rate are consistently low.  Patient verbalized agreement and understanding.

## 2014-07-30 ENCOUNTER — Telehealth: Payer: Self-pay | Admitting: Cardiovascular Disease

## 2014-07-30 MED ORDER — PROPRANOLOL HCL 10 MG PO TABS: 10.0000 mg | ORAL_TABLET | Freq: Four times a day (QID) | ORAL | Status: AC | PRN

## 2014-07-30 NOTE — Telephone Encounter (Signed)
Spoke with patient who states when he went to [facility] to pick up his Propranolol Rx, that pharmacist informed him that Atenolol has been discontinued.  I advised patient that I did not d/c Atenolol and will call pharmacy to find out reason and will call him back.  Patient verbalized understanding and agreement.

## 2014-07-30 NOTE — Telephone Encounter (Signed)
Pt wants to talk to [facility] re mix up with meds again--pls call cannot be reached  345p and after

## 2014-07-30 NOTE — Telephone Encounter (Signed)
I spoke with Juliann Pulse at [facility] who advised Propranolol was ordered 4 x daily.  I advised that Propranolol should be up to 4 x daily as needed and I advised that I will correct the Rx to indicate this and that patient will continue Atenolol 50 mg daily.  I apologized for not indicating prn status of Propranolol and thanked Juliann Pulse for her help.   I called and advised patient who thanked me for the call.

## 2014-08-21 ENCOUNTER — Ambulatory Visit (INDEPENDENT_AMBULATORY_CARE_PROVIDER_SITE_OTHER): Payer: Medicare Other | Admitting: Cardiovascular Disease

## 2014-08-21 ENCOUNTER — Encounter: Payer: Self-pay | Admitting: Cardiovascular Disease

## 2014-08-21 VITALS — BP 122/76 | HR 66 | Ht >= 80 in | Wt 258.0 lb

## 2014-08-21 DIAGNOSIS — E785 Hyperlipidemia, unspecified: Secondary | ICD-10-CM | POA: Diagnosis not present

## 2014-08-21 DIAGNOSIS — I10 Essential (primary) hypertension: Secondary | ICD-10-CM | POA: Diagnosis not present

## 2014-08-21 NOTE — Progress Notes (Signed)
Cardiology Office Note   Date:  08/21/2014   ID:  DEMARRI Hardy, DOB Apr 07, 1951, MRN [id number]  PCP:  Scarlette Calico, MD  Cardiologist:   Acie Fredrickson Wonda Cheng, MD   Chief Complaint  Patient presents with  . Follow-up    HTN   1. HTN 2. Ventricular Tachycardia 3. Abdominal aortic aneurism  History of Present Illness:  64 YO with a history of PAF, HTN, hyperlipidemia.   Rodney Hardy is a 64 year old gentleman with a history of ventricular tachycardia. He was hospitalized in may of this year and a cardiac catheterization revealed no significant coronary artery disease. His ventricular tachycardia has been well-controlled on flecainide and atenolol.  Had a dog bite accident recently. He was playing catch with his dog and the dog accidentally bit him on the hand. He's recovering quite nicely from that.  He is well controlled on Atenolol and flecainide. He was found to have an AAA that has enlarged slightly.  Aug 11, 2012:  Rodney Hardy is doing great. 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 Exercising occasionally. No CP or dypnea.   Aug 15, 2013:  Walking regularly with his dog in the am. Still working. Has a AAA. ( 3.4 x 4.2)   Aug 21, 2014:  Rodney Hardy is a 64 y.o. male who presents for follow-up of his hypertension and nonsustained VT. 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 Some palps , takes propranolol    Past Medical History  Diagnosis Date  . Hyperlipidemia 1995  . PAF (paroxysmal atrial fibrillation)   . Hypertensive heart disease   . Abdominal aortic aneurysm   . Migraine   . Joint pain   . Hypertension   . Arthritis     Past Surgical History  Procedure Laterality Date  . Other surgical history  2002    Bone Spur tooken off left elbow  . Cardiac catheterization  2013    Normal coronary arteries  . Tonsillectomy  1955  . Bone chip removal      left elbow     Current Outpatient  Prescriptions  Medication Sig Dispense Refill  . albuterol (PROVENTIL HFA;VENTOLIN HFA) 108 (90 BASE) MCG/ACT inhaler Inhale 2 puffs into the lungs every 6 (six) hours as needed for wheezing. 1 Inhaler 0  . Ascorbic Acid (VITAMIN C) 500 MG tablet Take 500 mg by mouth daily.      Marland Kitchen aspirin 325 MG tablet Take 325 mg by mouth daily.    Marland Kitchen atenolol (TENORMIN) 50 MG tablet Take 1 tablet (50 mg total) by mouth daily. 90 tablet 3  . atorvastatin (LIPITOR) 20 MG tablet Take 1 tablet (20 mg total) by mouth daily. 30 tablet 5  . Cholecalciferol (VITAMIN D) 1000 UNITS capsule Take 1,000 Units by mouth daily.      Marland Kitchen ezetimibe (ZETIA) 10 MG tablet Take 1 tablet (10 mg total) by mouth daily. 90 tablet 3  . flecainide (TAMBOCOR) 100 MG tablet take 1 tablet by mouth twice a day 60 tablet 11  . losartan-hydrochlorothiazide (HYZAAR) 100-12.5 MG per tablet Take 1 tablet by mouth daily. 90 tablet 3  . Multiple Vitamin (MULTIVITAMIN) tablet Take 1 tablet by mouth daily.      . propranolol (INDERAL) 10 MG tablet Take 1 tablet (10 mg total) by mouth 4 (four) times daily as needed. 90 tablet 6  . vitamin E 400 UNIT capsule Take 400 Units by mouth daily.  No current facility-administered medications for this visit.    Allergies:   Erythromycin; Lisinopril; and Procaine hcl    Social History:  The patient  reports that he quit smoking about 45 years ago. His smoking use included Cigarettes. He has a 40 pack-year smoking history. He has never used smokeless tobacco. He reports that he drinks about 9.0 oz of alcohol per week. He reports that he does not use illicit drugs.   Family History:  The patient's family history includes Arrhythmia in his sister; Lymphoma (age of onset: 85) in his father. There is no history of Cancer, Alcohol abuse, Arthritis, Heart disease, Hyperlipidemia, Hypertension, Stroke, Colon cancer, Esophageal cancer, Stomach cancer, or Rectal cancer.    ROS:  Please see the history of present  illness.    Review of Systems: Constitutional:  denies fever, chills, diaphoresis, appetite change and fatigue.  HEENT: denies photophobia, eye pain, redness, hearing loss, ear pain, congestion, sore throat, rhinorrhea, sneezing, neck pain, neck stiffness and tinnitus.  Respiratory: denies SOB, DOE, cough, chest tightness, and wheezing.  Cardiovascular: denies chest pain, palpitations and leg swelling.  Gastrointestinal: denies nausea, vomiting, abdominal pain, diarrhea, constipation, blood in stool.  Genitourinary: denies dysuria, urgency, frequency, hematuria, flank pain and difficulty urinating.  Musculoskeletal: denies  myalgias, back pain, joint swelling, arthralgias and gait problem.   Skin: denies pallor, rash and wound.  Neurological: denies dizziness, seizures, syncope, weakness, light-headedness, numbness and headaches.   Hematological: denies adenopathy, easy bruising, personal or family bleeding history.  Psychiatric/ Behavioral: denies suicidal ideation, mood changes, confusion, nervousness, sleep disturbance and agitation.       All other systems are reviewed and negative.    PHYSICAL EXAM: VS:  BP 122/76 mmHg  Pulse 66  Ht 6\' 8"  (2.032 m)  Wt 258 lb (117.028 kg)  BMI 28.34 kg/m2 , BMI Body mass index is 28.34 kg/(m^2). GEN: Well nourished, well developed, in no acute distress HEENT: normal Neck: no JVD, carotid bruits, or masses Cardiac: RRR; no murmurs, rubs, or gallops,no edema  Respiratory:  clear to auscultation bilaterally, normal work of breathing GI: soft, nontender, nondistended, + BS MS: no deformity or atrophy Skin: warm and dry, no rash Neuro:  Strength and sensation are intact Psych: normal   EKG:  EKG is ordered today. The ekg ordered today demonstrates :  NSR at 64 possible Inf. MI . No changes from previous     Recent Labs: No results found for requested labs within last 365 days.    Lipid Panel    Component Value Date/Time   CHOL 164  08/15/2013 1650   TRIG 132.0 08/15/2013 1650   HDL 78.50 08/15/2013 1650   CHOLHDL 2 08/15/2013 1650   VLDL 26.4 08/15/2013 1650   LDLCALC 59 08/15/2013 1650   LDLDIRECT 66.0 01/28/2012 1525      Wt Readings from Last 3 Encounters:  08/21/14 258 lb (117.028 kg)  08/15/13 246 lb (111.585 kg)  12/07/12 246 lb 3.2 oz (111.676 kg)      Other studies Reviewed: Additional studies/ records that were reviewed today include: . Review of the above records demonstrates:    ASSESSMENT AND PLAN:  1. HTN-  - BP is ok.  2. Ventricular Tachycardia 3. Abdominal aortic aneurism-   Scheduled for an ultrasound of the abdominal aorta.  4. Hyperlipidemia    Current medicines are reviewed at length with the patient today.  The patient does not have concerns regarding medicines.  The following changes have been made:  no change  Labs/ tests ordered today include:  No orders of the defined types were placed in this encounter.     Disposition:   FU with me in 1 year.      Lucianne Smestad, Wonda Cheng, MD  08/21/2014 3:33 PM    [facility] [place], [place], [place]  [address] Phone: [phone]; Fax: [phone]   [facility]  [address] [place], [place]  [address] [phone]    Fax [phone]

## 2014-08-21 NOTE — Patient Instructions (Signed)
Medication Instructions:  Your physician recommends that you continue on your current medications as directed. Please refer to the Current Medication list given to you today.   Labwork: TODAY - cholesterol, liver, basic metabolic panel   Testing/Procedures: None ordered  Follow-Up: Your physician wants you to follow-up in: 1 year with Dr. Acie Fredrickson.  You will receive a reminder letter in the mail two months in advance. If you don't receive a letter, please call our office to schedule the follow-up appointment.

## 2014-08-22 LAB — BASIC METABOLIC PANEL
BUN: 15 mg/dL (ref 6–23)
CHLORIDE: 99 meq/L (ref 96–112)
CO2: 28 mEq/L (ref 19–32)
Calcium: 9.4 mg/dL (ref 8.4–10.5)
Creatinine, Ser: 0.9 mg/dL (ref 0.40–1.50)
GFR: 89.9 mL/min (ref >60.00)
GLUCOSE: 88 mg/dL (ref 70–99)
Potassium: 4.5 mEq/L (ref 3.5–5.1)
SODIUM: 132 meq/L — AB (ref 135–145)

## 2014-08-22 LAB — HEPATIC FUNCTION PANEL
ALBUMIN: 4 g/dL (ref 3.5–5.2)
ALT: 21 U/L (ref 0–53)
AST: 26 U/L (ref 0–37)
Alkaline Phosphatase: 66 U/L (ref 39–117)
Bilirubin, Direct: 0.1 mg/dL (ref 0.0–0.3)
TOTAL PROTEIN: 6.9 g/dL (ref 6.0–8.3)
Total Bilirubin: 0.6 mg/dL (ref 0.2–1.2)

## 2014-08-22 LAB — LIPID PANEL
CHOLESTEROL: 163 mg/dL (ref 0–200)
HDL: 74.4 mg/dL (ref >39.00)
LDL Cholesterol: 60 mg/dL (ref 0–99)
NonHDL: 88.6
Total CHOL/HDL Ratio: 2
Triglycerides: 145 mg/dL (ref 0.0–149.0)
VLDL: 29 mg/dL (ref 0.0–40.0)

## 2014-08-27 ENCOUNTER — Other Ambulatory Visit: Payer: Self-pay | Admitting: Cardiovascular Disease

## 2014-08-27 DIAGNOSIS — I714 Abdominal aortic aneurysm, without rupture, unspecified: Secondary | ICD-10-CM

## 2014-09-03 ENCOUNTER — Ambulatory Visit (INDEPENDENT_AMBULATORY_CARE_PROVIDER_SITE_OTHER): Payer: Medicare Other | Admitting: Internal Medicine

## 2014-09-03 ENCOUNTER — Encounter (HOSPITAL_COMMUNITY): Payer: Self-pay

## 2014-09-03 ENCOUNTER — Other Ambulatory Visit (INDEPENDENT_AMBULATORY_CARE_PROVIDER_SITE_OTHER): Payer: Medicare Other

## 2014-09-03 ENCOUNTER — Ambulatory Visit (HOSPITAL_COMMUNITY): Payer: Medicare Other | Attending: Cardiovascular Disease

## 2014-09-03 ENCOUNTER — Encounter: Payer: Self-pay | Admitting: Internal Medicine

## 2014-09-03 ENCOUNTER — Other Ambulatory Visit: Payer: Self-pay | Admitting: Internal Medicine

## 2014-09-03 VITALS — BP 116/70 | HR 61 | Temp 98.2°F | Resp 18 | Ht >= 80 in | Wt 261.0 lb

## 2014-09-03 DIAGNOSIS — Z87891 Personal history of nicotine dependence: Secondary | ICD-10-CM | POA: Insufficient documentation

## 2014-09-03 DIAGNOSIS — E785 Hyperlipidemia, unspecified: Secondary | ICD-10-CM | POA: Diagnosis not present

## 2014-09-03 DIAGNOSIS — N400 Enlarged prostate without lower urinary tract symptoms: Secondary | ICD-10-CM

## 2014-09-03 DIAGNOSIS — Z23 Encounter for immunization: Secondary | ICD-10-CM | POA: Diagnosis not present

## 2014-09-03 DIAGNOSIS — D510 Vitamin B12 deficiency anemia due to intrinsic factor deficiency: Secondary | ICD-10-CM

## 2014-09-03 DIAGNOSIS — Z0000 Encounter for general adult medical examination without abnormal findings: Secondary | ICD-10-CM

## 2014-09-03 DIAGNOSIS — I10 Essential (primary) hypertension: Secondary | ICD-10-CM | POA: Diagnosis not present

## 2014-09-03 DIAGNOSIS — I480 Paroxysmal atrial fibrillation: Secondary | ICD-10-CM | POA: Diagnosis not present

## 2014-09-03 DIAGNOSIS — I714 Abdominal aortic aneurysm, without rupture, unspecified: Secondary | ICD-10-CM

## 2014-09-03 DIAGNOSIS — E538 Deficiency of other specified B group vitamins: Secondary | ICD-10-CM

## 2014-09-03 LAB — CBC WITH DIFFERENTIAL/PLATELET
BASOS PCT: 0.8 % (ref 0.0–3.0)
Basophils Absolute: 0 10*3/uL (ref 0.0–0.1)
EOS ABS: 0.2 10*3/uL (ref 0.0–0.7)
Eosinophils Relative: 2.9 % (ref 0.0–5.0)
HCT: 43.6 % (ref 39.0–52.0)
Hemoglobin: 15.3 g/dL (ref 13.0–17.0)
LYMPHS PCT: 22 % (ref 12.0–46.0)
Lymphs Abs: 1.3 10*3/uL (ref 0.7–4.0)
MCHC: 35 g/dL (ref 30.0–36.0)
MCV: 93.4 fl (ref 78.0–100.0)
MONOS PCT: 9.2 % (ref 3.0–12.0)
Monocytes Absolute: 0.5 10*3/uL (ref 0.1–1.0)
NEUTROS ABS: 3.8 10*3/uL (ref 1.4–7.7)
Neutrophils Relative %: 65.1 % (ref 43.0–77.0)
Platelets: 240 10*3/uL (ref 150.0–400.0)
RBC: 4.67 Mil/uL (ref 4.22–5.81)
RDW: 13.7 % (ref 11.5–15.5)
WBC: 5.9 10*3/uL (ref 4.0–10.5)

## 2014-09-03 LAB — IBC PANEL
IRON: 125 ug/dL (ref 42–165)
Saturation Ratios: 37.7 % (ref 20.0–50.0)
Transferrin: 237 mg/dL (ref 212.0–360.0)

## 2014-09-03 LAB — FOLATE: Folate: 18.8 ng/mL (ref >5.9)

## 2014-09-03 LAB — FECAL OCCULT BLOOD, GUAIAC: FECAL OCCULT BLD: NEGATIVE

## 2014-09-03 LAB — FERRITIN: Ferritin: 56.3 ng/mL (ref 22.0–322.0)

## 2014-09-03 LAB — VITAMIN B12: Vitamin B-12: 238 pg/mL (ref 211–911)

## 2014-09-03 LAB — PSA: PSA: 2.58 ng/mL (ref 0.10–4.00)

## 2014-09-03 LAB — TSH: TSH: 0.66 u[IU]/mL (ref 0.35–4.50)

## 2014-09-03 MED ORDER — CYANOCOBALAMIN 2000 MCG PO TABS: 2000.0000 ug | ORAL_TABLET | Freq: Every day | ORAL | Status: AC

## 2014-09-03 NOTE — Patient Instructions (Addendum)

## 2014-09-03 NOTE — Progress Notes (Signed)
Subjective:  Patient ID: Rodney Hardy, male    DOB: [birth year]  Age: 64 y.o. MRN: [id number]  CC: Medicare Wellness; Annual Exam; and Anemia   HPI Rodney Hardy presents for a CPX and follow up on anemia that was present on labs several years ago. He feels well and offers no complaints.  Outpatient Prescriptions Prior to Visit  Medication Sig Dispense Refill  . albuterol (PROVENTIL HFA;VENTOLIN HFA) 108 (90 BASE) MCG/ACT inhaler Inhale 2 puffs into the lungs every 6 (six) hours as needed for wheezing. 1 Inhaler 0  . Ascorbic Acid (VITAMIN C) 500 MG tablet Take 500 mg by mouth daily.      Marland Kitchen aspirin 325 MG tablet Take 325 mg by mouth daily.    Marland Kitchen atenolol (TENORMIN) 50 MG tablet Take 1 tablet (50 mg total) by mouth daily. 90 tablet 3  . Cholecalciferol (VITAMIN D) 1000 UNITS capsule Take 1,000 Units by mouth daily.      Marland Kitchen ezetimibe (ZETIA) 10 MG tablet Take 1 tablet (10 mg total) by mouth daily. 90 tablet 3  . flecainide (TAMBOCOR) 100 MG tablet take 1 tablet by mouth twice a day 60 tablet 11  . losartan-hydrochlorothiazide (HYZAAR) 100-12.5 MG per tablet Take 1 tablet by mouth daily. 90 tablet 3  . Multiple Vitamin (MULTIVITAMIN) tablet Take 1 tablet by mouth daily.      . propranolol (INDERAL) 10 MG tablet Take 1 tablet (10 mg total) by mouth 4 (four) times daily as needed. 90 tablet 6  . vitamin E 400 UNIT capsule Take 400 Units by mouth daily.      Marland Kitchen atorvastatin (LIPITOR) 20 MG tablet Take 1 tablet (20 mg total) by mouth daily. 30 tablet 5   No facility-administered medications prior to visit.    ROS Review of Systems  Constitutional: Negative.  Negative for fever, chills, diaphoresis, appetite change and fatigue.  HENT: Negative.   Eyes: Negative.   Respiratory: Negative.  Negative for apnea, cough, choking, chest tightness, shortness of breath, wheezing and stridor.   Cardiovascular: Negative.  Negative for chest pain, palpitations and leg swelling.    Gastrointestinal: Negative.  Negative for nausea, vomiting, abdominal pain, diarrhea, constipation and blood in stool.  Endocrine: Negative.   Genitourinary: Negative.   Musculoskeletal: Negative.  Negative for myalgias, back pain, arthralgias and neck pain.  Skin: Negative.   Allergic/Immunologic: Negative.   Neurological: Negative.  Negative for dizziness, tremors, facial asymmetry, weakness, light-headedness and numbness.  Hematological: Negative.  Negative for adenopathy. Does not bruise/bleed easily.    Objective:  BP 116/70 mmHg  Pulse 61  Temp(Src) 98.2 F (36.8 C) (Oral)  Resp 18  Ht 6\' 8"  (2.032 m)  Wt 261 lb (118.389 kg)  BMI 28.67 kg/m2  SpO2 96%  BP Readings from Last 3 Encounters:  09/03/14 116/70  08/21/14 122/76  08/15/13 138/77    Wt Readings from Last 3 Encounters:  09/03/14 261 lb (118.389 kg)  08/21/14 258 lb (117.028 kg)  08/15/13 246 lb (111.585 kg)    Physical Exam  Constitutional: He is oriented to person, place, and time. He appears well-developed and well-nourished.  Non-toxic appearance. He does not have a sickly appearance. He does not appear ill. No distress.  HENT:  Head: Normocephalic and atraumatic.  Mouth/Throat: Oropharynx is clear and moist. No oropharyngeal exudate.  Eyes: Conjunctivae are normal. Right eye exhibits no discharge. Left eye exhibits no discharge. No scleral icterus.  Neck: Normal range of motion. Neck supple. No JVD  present. No tracheal deviation present. No thyromegaly present.  Cardiovascular: Normal rate, regular rhythm, normal heart sounds and intact distal pulses.  Exam reveals no gallop and no friction rub.   No murmur heard. Pulmonary/Chest: Effort normal and breath sounds normal. No stridor. No respiratory distress. He has no wheezes. He has no rales. He exhibits no tenderness.  Abdominal: Soft. Bowel sounds are normal. He exhibits no distension and no mass. There is no tenderness. There is no rebound and no  guarding. Hernia confirmed negative in the right inguinal area and confirmed negative in the left inguinal area.  Genitourinary: Rectum normal, testes normal and penis normal. Rectal exam shows no external hemorrhoid, no internal hemorrhoid, no fissure, no mass, no tenderness and anal tone normal. Guaiac negative stool. Prostate is enlarged (1+ smooth symm BPH). Prostate is not tender. Right testis shows no mass, no swelling and no tenderness. Right testis is descended. Left testis shows no mass, no swelling and no tenderness. Left testis is descended. Circumcised. No paraphimosis, penile erythema or penile tenderness. No discharge found.  Musculoskeletal: Normal range of motion. He exhibits no edema or tenderness.  Lymphadenopathy:    He has no cervical adenopathy.       Right: No inguinal adenopathy present.       Left: No inguinal adenopathy present.  Neurological: He is oriented to person, place, and time.  Skin: Skin is warm and dry. No rash noted. He is not diaphoretic. No erythema. No pallor.  Psychiatric: He has a normal mood and affect. His behavior is normal. Judgment and thought content normal.  Vitals reviewed.   Lab Results  Component Value Date   WBC 5.1 06/11/2012   HGB 14.6 06/11/2012   HCT 43.1* 06/11/2012   PLT 251.0 01/28/2012   GLUCOSE 88 08/21/2014   CHOL 163 08/21/2014   TRIG 145.0 08/21/2014   HDL 74.40 08/21/2014   LDLDIRECT 66.0 01/28/2012   LDLCALC 60 08/21/2014   ALT 21 08/21/2014   AST 26 08/21/2014   NA 132* 08/21/2014   K 4.5 08/21/2014   CL 99 08/21/2014   CREATININE 0.90 08/21/2014   BUN 15 08/21/2014   CO2 28 08/21/2014   TSH 1.07 01/28/2012   PSA 2.06 01/28/2012   HGBA1C 5.3 01/28/2012    No results found.  Assessment & Plan:   Tamarion was seen today for medicare wellness and annual exam.  Diagnoses and all orders for this visit:  Routine general medical examination at a health care facility - The written screening recommendations is  given to patient and attached in the patent instructions or AVS.   The patient is here for annual Medicare wellness examination and management of other chronic and acute problems.   The risk factors are reflected in the social history.  The roster of all physicians providing medical care to patient - is listed in the Snapshot section of the chart.  Activities of daily living:  The patient is 100% inedpendent in all ADLs: dressing, toileting, feeding as well as independent mobility  Home safety : The patient has smoke detectors in the home. They wear seatbelts.No firearms at home ( firearms are present in the home, kept in a safe fashion). There is no violence in the home.   There is no risks for hepatitis, STDs or HIV. There is no   history of blood transfusion. They have no travel history to infectious disease endemic areas of the world.  The patient has (has not) seen their dentist in the  last six month. They have (not) seen their eye doctor in the last year. They deny (admit to) any hearing difficulty and have not had audiologic testing in the last year.  They do not  have excessive sun exposure. Discussed the need for sun protection: hats, long sleeves and use of sunscreen if there is significant sun exposure.   Diet: the importance of a healthy diet is discussed. They do have a healthy (unhealthy-high fat/fast food) diet.  The patient has a regular exercise program.  The benefits of regular aerobic exercise were discussed.  Depression screen: there are no signs or vegative symptoms of depression- irritability, change in appetite, anhedonia, sadness/tearfullness.  Cognitive assessment: the patient manages all their financial and personal affairs and is actively engaged. They could relate day,date,year and events; recalled 3/3 objects at 3 minutes; performed clock-face test normally.  The following portions of the patient's history were reviewed and updated as appropriate: allergies,  current medications, past family history, past medical history,  past surgical history, past social history  and problem list.  Vision, hearing, body mass index were assessed and reviewed.   During the course of the visit the patient was educated and counseled about appropriate screening and preventive services including : fall prevention , diabetes screening, nutrition counseling, colorectal cancer screening, and recommended immunizations.   BPH (benign prostatic hyperplasia) Orders: -     PSA; Future  Pernicious anemia - he has no s/s of blood loss or anemia, will recheck his CBC and his vitamin levels Orders: -     TSH; Future -     CBC with Differential/Platelet; Future -     Vitamin B12; Future -     Folate; Future -     Ferritin; Future -     IBC panel; Future   I have discontinued Mr. Granja's atorvastatin. I am also having him maintain his multivitamin, vitamin E, vitamin C, Vitamin D, albuterol, aspirin, losartan-hydrochlorothiazide, atenolol, ezetimibe, flecainide, and propranolol.  No orders of the defined types were placed in this encounter.   See AVS for instructions about healthy living and anticipatory guidance.  Follow-up: Return in about 6 months (around 03/06/2015).  Scarlette Calico, MD

## 2014-09-03 NOTE — Assessment & Plan Note (Signed)

## 2014-09-03 NOTE — Progress Notes (Signed)
Pre visit review using our clinic review tool, if applicable. No additional management support is needed unless otherwise documented below in the visit note. 

## 2014-09-04 ENCOUNTER — Telehealth: Payer: Self-pay | Admitting: Nurse Practitioner

## 2014-09-04 DIAGNOSIS — I714 Abdominal aortic aneurysm, without rupture, unspecified: Secondary | ICD-10-CM

## 2014-09-04 NOTE — Telephone Encounter (Signed)
-----   Message from Thayer Headings, MD sent at 09/03/2014 12:51 PM EDT ----- The AAA appears stable. No enlargement Repeat study in 6 months or as otherwise directed by [facility] ( is he being followed by [facility] ? )

## 2014-09-04 NOTE — Telephone Encounter (Signed)
Lets go ahead an refer him to [facility] and have them follow .

## 2014-09-04 NOTE — Telephone Encounter (Signed)
Called patient and reviewed results of IVC/Iliac duplex; patient verbalized understanding.  He would like to know if repeating study in 6 months is necessary as he states most of the cost is paid out-of-pocket.  Patient has not seen anyone at [facility].  I advised patient that I would discuss referral with Dr. Acie Fredrickson and whether he should go ahead and meet with one of the surgeons at [facility] or whether we should wait for repeat study to show growth in the size of the AAA.  Patient verbalized understanding and agreement.

## 2014-09-05 NOTE — Telephone Encounter (Signed)
Spoke with patient and advised him that per Dr. Acie Fredrickson, he has been referred to [facility] for evaluation of his AAA.  Patient verbalized understanding and agreement with plan.

## 2014-09-21 ENCOUNTER — Encounter: Payer: Self-pay | Admitting: Vascular Surgery

## 2014-09-24 ENCOUNTER — Telehealth: Payer: Self-pay | Admitting: Internal Medicine

## 2014-09-24 NOTE — Telephone Encounter (Signed)
Patient was in a few weeks ago and he had accidentally said to take atorvastatin (LIPITOR) 20 MG tablet [[id number] off his medication list. He is still taking this so it needs to be added back on. Also his prescription for albuteral inhaler can be taken off the list.

## 2014-09-24 NOTE — Telephone Encounter (Signed)
Patient medication list updated per pt message.

## 2014-09-25 ENCOUNTER — Encounter: Payer: Self-pay | Admitting: Vascular Surgery

## 2014-09-25 ENCOUNTER — Ambulatory Visit (INDEPENDENT_AMBULATORY_CARE_PROVIDER_SITE_OTHER): Payer: Medicare Other | Admitting: Vascular Surgery

## 2014-09-25 VITALS — BP 135/83 | HR 63 | Ht >= 80 in | Wt 259.6 lb

## 2014-09-25 DIAGNOSIS — I714 Abdominal aortic aneurysm, without rupture, unspecified: Secondary | ICD-10-CM

## 2014-09-25 NOTE — Progress Notes (Signed)
Patient name: Rodney Hardy MRN: [id number] DOB: 1950-12-02 Sex: male   Referred by: Acie Fredrickson  Reason for referral:  Chief Complaint  Patient presents with  . New Evaluation    eval AAA    HISTORY OF PRESENT ILLNESS: The patient resents today for scheduled of infrarenal abdominal aortic aneurysm. He is a very pleasant 64 year old gentleman who has known about his aneurysm for proximal for years. This was initially found on plain films of the spine when seeing a chiropractor. His brother who was 14 years older of died of a ruptured aneurysm several years ago. He did not know about his aneurysm had ruptured aneurysm and was transported by helicopter to [place]. He did not survive emergency surgery and the hospitalization. He has no other family history of aneurysmal disease. He has no history of peripheral vascular occlusive disease or stroke. He is quite active. He does have a history of atrial fibrillation but is in normal sinus rhythm.  Past Medical History  Diagnosis Date  . Hyperlipidemia 1995  . PAF (paroxysmal atrial fibrillation)   . Hypertensive heart disease   . Abdominal aortic aneurysm   . Migraine   . Joint pain   . Hypertension   . Arthritis     Past Surgical History  Procedure Laterality Date  . Other surgical history  2002    Bone Spur tooken off left elbow  . Cardiac catheterization  2013    Normal coronary arteries  . Tonsillectomy  1955  . Bone chip removal      left elbow    History   Social History  . Marital Status: Married    Spouse Name: N/A  . Number of Children: N/A  . Years of Education: N/A   Occupational History  . Not on file.   Social History Main Topics  . Smoking status: Former Smoker -- 2.00 packs/day for 20 years    Types: Cigarettes    Quit date: 04/13/1969  . Smokeless tobacco: Never Used  . Alcohol Use: 9.0 oz/week    15 Cans of beer per week  . Drug Use: No  . Sexual Activity: Yes   Other Topics Concern  .  Not on file   Social History Narrative    Family History  Problem Relation Age of Onset  . Lymphoma Father 82  . Arrhythmia Sister   . Cancer Neg Hx   . Alcohol abuse Neg Hx   . Arthritis Neg Hx   . Heart disease Neg Hx   . Hyperlipidemia Neg Hx   . Hypertension Neg Hx   . Stroke Neg Hx   . Colon cancer Neg Hx   . Esophageal cancer Neg Hx   . Stomach cancer Neg Hx   . Rectal cancer Neg Hx   . AAA (abdominal aortic aneurysm) Brother     Allergies as of 09/25/2014 - Review Complete 09/25/2014  Allergen Reaction Noted  . Erythromycin Other (See Comments) 08/14/2010  . Lisinopril Cough 08/14/2010  . Procaine hcl Other (See Comments) 08/07/2010    Current Outpatient Prescriptions on File Prior to Visit  Medication Sig Dispense Refill  . Ascorbic Acid (VITAMIN C) 500 MG tablet Take 500 mg by mouth daily.      Marland Kitchen aspirin 325 MG tablet Take 325 mg by mouth daily.    Marland Kitchen atenolol (TENORMIN) 50 MG tablet Take 1 tablet (50 mg total) by mouth daily. 90 tablet 3  . atorvastatin (LIPITOR) 20 MG tablet   0  .  Cholecalciferol (VITAMIN D) 1000 UNITS capsule Take 1,000 Units by mouth daily.      . cyanocobalamin 2000 MCG tablet Take 1 tablet (2,000 mcg total) by mouth daily. 90 tablet 3  . ezetimibe (ZETIA) 10 MG tablet Take 1 tablet (10 mg total) by mouth daily. 90 tablet 3  . flecainide (TAMBOCOR) 100 MG tablet take 1 tablet by mouth twice a day 60 tablet 11  . losartan-hydrochlorothiazide (HYZAAR) 100-12.5 MG per tablet Take 1 tablet by mouth daily. 90 tablet 3  . Multiple Vitamin (MULTIVITAMIN) tablet Take 1 tablet by mouth daily.      . propranolol (INDERAL) 10 MG tablet Take 1 tablet (10 mg total) by mouth 4 (four) times daily as needed. 90 tablet 6  . vitamin E 400 UNIT capsule Take 400 Units by mouth daily.       No current facility-administered medications on file prior to visit.     REVIEW OF SYSTEMS:  Positives indicated with an "X"  CARDIOVASCULAR:  [ ]  chest pain   [ ]   chest pressure   [ ]  palpitations   [ ]  orthopnea   [ ]  dyspnea on exertion   [ ]  claudication   [ ]  rest pain   [ ]  DVT   [ ]  phlebitis PULMONARY:   [ ]  productive cough   [ ]  asthma   [ ]  wheezing NEUROLOGIC:   [ ]  weakness  [ ]  paresthesias  [ ]  aphasia  [ ]  amaurosis  [ ]  dizziness HEMATOLOGIC:   [ ]  bleeding problems   [ ]  clotting disorders MUSCULOSKELETAL:  [ ]  joint pain   [ ]  joint swelling GASTROINTESTINAL: [ ]   blood in stool  [ ]   hematemesis GENITOURINARY:  [ ]   dysuria  [ ]   hematuria PSYCHIATRIC:  [ ]  history of major depression INTEGUMENTARY:  [ ]  rashes  [ ]  ulcers CONSTITUTIONAL:  [ ]  fever   [ ]  chills  PHYSICAL EXAMINATION:  General: The patient is a well-nourished male, in no acute distress. Vital signs are BP 135/83 mmHg  Pulse 63  Ht 6\' 8"  (2.032 m)  Wt 259 lb 9.6 oz (117.754 kg)  BMI 28.52 kg/m2  SpO2 98% Pulmonary: There is a good air exchange bilaterally without wheezing or rales. Abdomen: Soft and non-tender with normal pitch bowel sounds. Do not feel an aneurysm Musculoskeletal: There are no major deformities.  There is no significant extremity pain. Neurologic: No focal weakness or paresthesias are detected, Skin: There are no ulcer or rashes noted. Psychiatric: The patient has normal affect. Cardiovascular: There is a regular rate and rhythm without significant murmur appreciated. Carotid arteries without bruits bilaterally Pulse status: 2+ radial 2+ femoral 2+ popliteal and 2+ dorsalis pedis pulses bilaterally. He does have a prominent left popliteal pulse but does not appear to be aneurysmal.   Vascular Lab Studies:  I have his ultrasound results from 08/09/2013 at [facility]. Maximal diameter of his aorta is 4.0 cm.  Impression and Plan:  Had long discussion with patient regarding his aneurysm. I explained the natural history of this and the familial incidence of this. I explained that there was no need for any restriction as far as  activity goes. Also explained that with me. Blood pressure control. I did explain symptoms of leaking aneurysm and that he would report immediately to The hospital with this occur. I explained that our protocol is for yearly ultrasound for aneurysms less than 4.5 cm and every 6  months for greater than 4.5 cm. I explained that that with his age and excellent health would consider aneurysm repair electively should his aneurysm reach 5.0-5.5 cm. I explained that he could continue to have his ultrasound with his regular cardiology visits to save him a doctor visit and he is comfortable with this. Also explained that if his aneurysm continues to grow that we would follow him in our office with ultrasound. This and will continue his ultrasound surveillance at [facility]    Jc Veron [facility]: [phone]

## 2014-10-05 ENCOUNTER — Ambulatory Visit (INDEPENDENT_AMBULATORY_CARE_PROVIDER_SITE_OTHER): Payer: Medicare Other

## 2014-10-05 ENCOUNTER — Ambulatory Visit (INDEPENDENT_AMBULATORY_CARE_PROVIDER_SITE_OTHER): Payer: Medicare Other | Admitting: Family Medicine

## 2014-10-05 VITALS — BP 130/60 | HR 71 | Temp 98.5°F | Resp 17 | Ht >= 80 in | Wt 261.8 lb

## 2014-10-05 DIAGNOSIS — L089 Local infection of the skin and subcutaneous tissue, unspecified: Secondary | ICD-10-CM

## 2014-10-05 DIAGNOSIS — S60459A Superficial foreign body of unspecified finger, initial encounter: Principal | ICD-10-CM

## 2014-10-05 DIAGNOSIS — S60451A Superficial foreign body of left index finger, initial encounter: Secondary | ICD-10-CM

## 2014-10-05 MED ORDER — DOXYCYCLINE HYCLATE 100 MG PO CAPS: 100.0000 mg | ORAL_CAPSULE | Freq: Two times a day (BID) | ORAL | Status: AC

## 2014-10-05 NOTE — Progress Notes (Signed)
Patient ID: Rodney Hardy, male    DOB: Feb 23, 1951, 64 y.o.   MRN: [id number]  PCP: Rodney Calico, MD   Subjective:  HPI Pt is a 64 y/o male presenting for clinic for evaluation of a possible splinter.  He was replacing a board in the ceiling of an old building when his hand slipped and is left index finger got caught in a piece of splintered wood. The event resulted in 2 tiny puncture wounds to the lateral aspect of the distal left index finger. They bled very little. He used tweezers and pulled out a very small splinter from the smaller of the 2 puncture wounds, and dug around with the tweezers in the larger puncture wound. He did not find anything in the larger puncture wound, but is sure that there is something in there, so he came to the clinic. He woke up this morning with swelling, redness, and throbbing pain in his left index finger, distal to the DIP. He has limited ROM in his DIP d/t pain and swelling. He noticed some clear drainage from the larger puncture wound. He tried soaking his finger in warm water with epsom salt, without relief. Pt reports his last tetanus shot was in 2010.   Review of Systems  Constitutional: Negative.   Respiratory: Negative.   Cardiovascular: Negative.   Gastrointestinal: Negative for nausea and vomiting.  Musculoskeletal:       Decreased ROM in left index DIP.  Skin: Positive for color change (redness of distal left index finger) and wound (2 puncture wounds on lateral aspect of distal left index finger). Negative for pallor and rash.  Neurological: Negative.      Patient Active Problem List   Diagnosis Date Noted  . BPH (benign prostatic hyperplasia) 09/03/2014  . Pernicious anemia 09/03/2014  . B12 deficiency 09/03/2014  . Other abnormal glucose 01/28/2012  . Routine general medical examination at a health care facility 01/28/2012  . Hyperlipidemia   . PAF (paroxysmal atrial fibrillation)   . Essential hypertension, benign   .  Abdominal aortic aneurysm     Past Medical History  Diagnosis Date  . Hyperlipidemia 1995  . PAF (paroxysmal atrial fibrillation)   . Hypertensive heart disease   . Abdominal aortic aneurysm   . Migraine   . Joint pain   . Hypertension   . Arthritis     Prior to Admission medications   Medication Sig Start Date End Date Taking? Authorizing Provider  Ascorbic Acid (VITAMIN C) 500 MG tablet Take 500 mg by mouth daily.     Yes Historical Provider, MD  aspirin 325 MG tablet Take 325 mg by mouth daily.   Yes Historical Provider, MD  atenolol (TENORMIN) 50 MG tablet Take 1 tablet (50 mg total) by mouth daily. 04/23/14 04/23/15 Yes Thayer Headings, MD  atorvastatin (LIPITOR) 20 MG tablet  09/08/14  Yes Historical Provider, MD  Cholecalciferol (VITAMIN D) 1000 UNITS capsule Take 1,000 Units by mouth daily.     Yes Historical Provider, MD  cyanocobalamin 2000 MCG tablet Take 1 tablet (2,000 mcg total) by mouth daily. 09/03/14  Yes Janith Lima, MD  ezetimibe (ZETIA) 10 MG tablet Take 1 tablet (10 mg total) by mouth daily. 04/23/14  Yes Thayer Headings, MD  flecainide [facility]) 100 MG tablet take 1 tablet by mouth twice a day 04/23/14  Yes Thayer Headings, MD  losartan-hydrochlorothiazide [facility]) 100-12.5 MG per tablet Take 1 tablet by mouth daily. 04/23/14  Yes Wonda Cheng  Nahser, MD  Multiple Vitamin (MULTIVITAMIN) tablet Take 1 tablet by mouth daily.     Yes Historical Provider, MD  propranolol (INDERAL) 10 MG tablet Take 1 tablet (10 mg total) by mouth 4 (four) times daily as needed. 07/30/14  Yes Thayer Headings, MD  Thiamine HCl (VITAMIN B-1 PO) Take by mouth daily.   Yes Historical Provider, MD  vitamin E 400 UNIT capsule Take 400 Units by mouth daily.     Yes Historical Provider, MD    Allergies  Allergen Reactions  . Erythromycin Other (See Comments)    unknown  . Lisinopril Cough  . Procaine Hcl Other (See Comments)    Pt's heart races.    Past Medical, Surgical Family and Social  History reviewed and updated.   Objective:   Vitals: BP 130/60 mmHg  Pulse 71  Temp(Src) 98.5 F (36.9 C) (Oral)  Resp 17  Ht 6\' 8"  (2.032 m)  Wt 261 lb 12.8 oz (118.752 kg)  BMI 28.76 kg/m2  SpO2 96%   Physical Exam  Constitutional: He is oriented to person, place, and time. He appears well-developed and well-nourished. He is cooperative. No distress.  HENT:  Head: Normocephalic and atraumatic.  Cardiovascular: Normal rate, regular rhythm and normal heart sounds.   Pulmonary/Chest: Effort normal and breath sounds normal.  Musculoskeletal:       Left hand: He exhibits decreased range of motion (at left index DIP), tenderness (to palpation of entire swollen area.) and swelling (area around left distal phalanx, including DIP, swollen with associated erythema laterally to midline on the dorsal side of his finger.). He exhibits no deformity. Normal strength noted.       Hands: Lymphadenopathy:       Head (right side): No submental, no submandibular, no tonsillar, no preauricular, no posterior auricular and no occipital adenopathy present.       Head (left side): No submental, no submandibular, no tonsillar, no preauricular, no posterior auricular and no occipital adenopathy present.    He has no cervical adenopathy.  Neurological: He is alert and oriented to person, place, and time.  Skin: Skin is warm and dry.  See musculoskeletal.  Psychiatric: He has a normal mood and affect. His speech is normal and behavior is normal.    [facility] reading (PRIMARY) of left index finger x-ray by  Dr. Carlota Raspberry: No evidence of foreign body or bony involvement.   Assessment & Plan:   [name] was seen today for splinter in finger.  Diagnoses and all orders for this visit:  Foreign body in finger-infected, initial encounter -     No visible foreign bodies externally or on x-ray. -     Wound looks like it has developed some cellulitis, will treat with antibiotics. -     If there is a foreign body,  hopefully it will come out on its own over the next few days. -     Pt should soak finger in warm water or apply warm compress to finger to help draw it out. -     If he does not see improvement with the antibiotic over the next 48 hours, he should return to clinic for re-evaluation. Orders: -     DG Finger Index Left; Future -     doxycycline (VIBRAMYCIN) 100 MG capsule; Take 1 capsule (100 mg total) by mouth 2 (two) times daily.   Trenton Verne, PA-S [facility] 10/05/2014 7:35 PM

## 2014-10-05 NOTE — Progress Notes (Signed)
Patient ID: Rodney Hardy, male    DOB: 1950/10/21, 64 y.o.   MRN: [id number]  PCP: Scarlette Calico, MD  Subjective:   Chief Complaint  Patient presents with  . Splinter in Finger    Left pointer finger - x yesterday    HPI Presents for evaluation of pain and swelling of the LEFT index finger, possible splinter.  Replacing a board on the ceiling when his left index finger got caught in a splintered piece of wood. Poked around puncture wound to see if splinter in there with needle-tipped tweezers, didn't find anything. Found small splinter in smaller puncture wound. Woke up this morning, it was swollen, red, and tender. Clear drainage. Soaked it in warm water and epsom salt.     Review of Systems Constitutional: Negative.  Respiratory: Negative.  Cardiovascular: Negative.  Gastrointestinal: Negative for nausea and vomiting.  Musculoskeletal:  Decreased ROM in left index DIP.  Skin: Positive for color change (redness of distal left index finger) and wound (2 puncture wounds on lateral aspect of distal left index finger). Negative for pallor and rash.  Neurological: Negative.      Patient Active Problem List   Diagnosis Date Noted  . BPH (benign prostatic hyperplasia) 09/03/2014  . Pernicious anemia 09/03/2014  . B12 deficiency 09/03/2014  . Other abnormal glucose 01/28/2012  . Routine general medical examination at a health care facility 01/28/2012  . Hyperlipidemia   . PAF (paroxysmal atrial fibrillation)   . Essential hypertension, benign   . Abdominal aortic aneurysm      Prior to Admission medications   Medication Sig Start Date End Date Taking? Authorizing Provider  Ascorbic Acid (VITAMIN C) 500 MG tablet Take 500 mg by mouth daily.     Yes Historical Provider, MD  aspirin 325 MG tablet Take 325 mg by mouth daily.   Yes Historical Provider, MD  atenolol (TENORMIN) 50 MG tablet Take 1 tablet (50 mg total) by mouth daily. 04/23/14 04/23/15 Yes Thayer Headings,  MD  atorvastatin (LIPITOR) 20 MG tablet  09/08/14  Yes Historical Provider, MD  Cholecalciferol (VITAMIN D) 1000 UNITS capsule Take 1,000 Units by mouth daily.     Yes Historical Provider, MD  cyanocobalamin 2000 MCG tablet Take 1 tablet (2,000 mcg total) by mouth daily. 09/03/14  Yes Janith Lima, MD  ezetimibe (ZETIA) 10 MG tablet Take 1 tablet (10 mg total) by mouth daily. 04/23/14  Yes Thayer Headings, MD  flecainide [facility]) 100 MG tablet take 1 tablet by mouth twice a day 04/23/14  Yes Thayer Headings, MD  losartan-hydrochlorothiazide [facility]) 100-12.5 MG per tablet Take 1 tablet by mouth daily. 04/23/14  Yes Thayer Headings, MD  Multiple Vitamin (MULTIVITAMIN) tablet Take 1 tablet by mouth daily.     Yes Historical Provider, MD  propranolol (INDERAL) 10 MG tablet Take 1 tablet (10 mg total) by mouth 4 (four) times daily as needed. 07/30/14  Yes Thayer Headings, MD  Thiamine HCl (VITAMIN B-1 PO) Take by mouth daily.   Yes Historical Provider, MD  vitamin E 400 UNIT capsule Take 400 Units by mouth daily.     Yes Historical Provider, MD     Allergies  Allergen Reactions  . Erythromycin Other (See Comments)    unknown  . Lisinopril Cough  . Procaine Hcl Other (See Comments)    Pt's heart races.       Objective:  Physical Exam  Constitutional: He is oriented to person, place, and time.  He appears well-developed and well-nourished. He is active and cooperative. No distress.  BP 130/60 mmHg  Pulse 71  Temp(Src) 98.5 F (36.9 C) (Oral)  Resp 17  Ht 6\' 8"  (2.032 m)  Wt 261 lb 12.8 oz (118.752 kg)  BMI 28.76 kg/m2  SpO2 96%   Eyes: Conjunctivae are normal.  Pulmonary/Chest: Effort normal.  Musculoskeletal:       Left hand: He exhibits decreased range of motion (DIP), tenderness and swelling (mild). He exhibits no bony tenderness and normal capillary refill. Lacerations: two small puncture wounds on the radial aspect of the distal index finger. Normal sensation noted. Normal  strength noted.       Hands: Neurological: He is alert and oriented to person, place, and time.  Psychiatric: He has a normal mood and affect. His speech is normal and behavior is normal.    LEFT index finger: [facility] reading (PRIMARY) by  Dr. Carlota Raspberry. No FB. No bony deformity.         Assessment & Plan:   1. Foreign body in finger-infected, initial encounter No FB seen on Xray. Given the location and questionable presence of FB, elect to treat cellulitis initially and re-evaluate in 48 hours if no considerable improvement. At that point, reconsider exploration vs. Referral to hand specialty if presence of FB suspected. - DG Finger Index Left; Future - doxycycline (VIBRAMYCIN) 100 MG capsule; Take 1 capsule (100 mg total) by mouth 2 (two) times daily.  Dispense: 20 capsule; Refill: 0   Fara Chute, PA-C Physician Assistant-Certified [facility] .

## 2014-10-05 NOTE — Patient Instructions (Signed)
Soak the finger in warm water (or apply a heating pad to the area) for 15-20 minutes 2-4 times daily. If you are not significantly improved in 48 hours, please return for re-evaluation.  Use Ibuprofen as needed for pain (it will reduce inflammation).

## 2014-10-06 NOTE — Progress Notes (Signed)
Xray read and patient discussed with Ms. Jacqulynn Cadet and Ms Hilsinger . Agree with assessment and plan of care per their notes.

## 2014-10-19 ENCOUNTER — Telehealth: Payer: Self-pay | Admitting: Cardiovascular Disease

## 2014-10-19 ENCOUNTER — Telehealth: Payer: Self-pay | Admitting: Internal Medicine

## 2014-10-22 ENCOUNTER — Telehealth: Payer: Self-pay | Admitting: Cardiovascular Disease

## 2014-10-22 NOTE — Telephone Encounter (Signed)
Dr.Nahser signed d/c faxed copy to [facility] also called and they are aware original ready for pick up.

## 2014-10-24 ENCOUNTER — Telehealth: Payer: Self-pay | Admitting: Internal Medicine

## 2014-10-24 ENCOUNTER — Telehealth: Payer: Self-pay | Admitting: Cardiovascular Disease

## 2014-10-24 NOTE — Telephone Encounter (Signed)
Death Certificate picked up.Marland Kitchen

## 2014-10-24 NOTE — Telephone Encounter (Signed)
Pt wife called and just wanted you to know that pt past way 7/8.  She didn't give me any other info.  But she she wanted you to know.

## 2014-11-12 NOTE — Telephone Encounter (Signed)
Faxed copy of d/c received from [facility] will be in office Monday 10/22/14 will  get this to him then-

## 2014-11-12 NOTE — Telephone Encounter (Signed)
Ron from [employer] needs you to call him right away regarding patient. The patient has passed away and he needs to speak to you. His number is [phone]

## 2014-11-12 NOTE — Telephone Encounter (Signed)
i called and talked with ron/ems---patient died at home, no fowl play suspected, [place] police dept also on scene---i advised ron that dr Ronnald Ramp is not here today and has not seen patient real recently---patient was last seen by dr nasher/cardiology--ron is wanting verbal order stating that dr Ronnald Ramp is ok with signing this patients death certificate before he can move body---i suggested he call dr Cathie Olden to see if he is in office today---and if he still needs assist, he can call me back, or we can talk with dr Ronnald Ramp once he returns on Monday---Ron/ems is to call me back

## 2014-11-12 NOTE — Telephone Encounter (Signed)
Call received from [facility] [phone]) he was at patients home called in to see if Dr.Nahser will sign d/c for this pt, after Sharyn Lull called and spoke with Dr.Naher  He will sign called Ron back made him aware.

## 2014-11-12 DEATH — deceased

## 2015-01-24 ENCOUNTER — Telehealth: Payer: Self-pay | Admitting: Cardiovascular Disease

## 2015-01-24 NOTE — Telephone Encounter (Signed)
Mrs. Mcfadden called to say that Mr. Henderickson Passed away in 2022-10-30 .Marland Kitchen  Thanks

## 2015-03-05 IMAGING — CR DG HAND COMPLETE 3+V*L*
3 series · 3 of 3 positions shown · non-contrast
Comparison: None.

CLINICAL DATA: Left hand and index finger pain

LEFT HAND - COMPLETE 3+ VIEW

[PA]
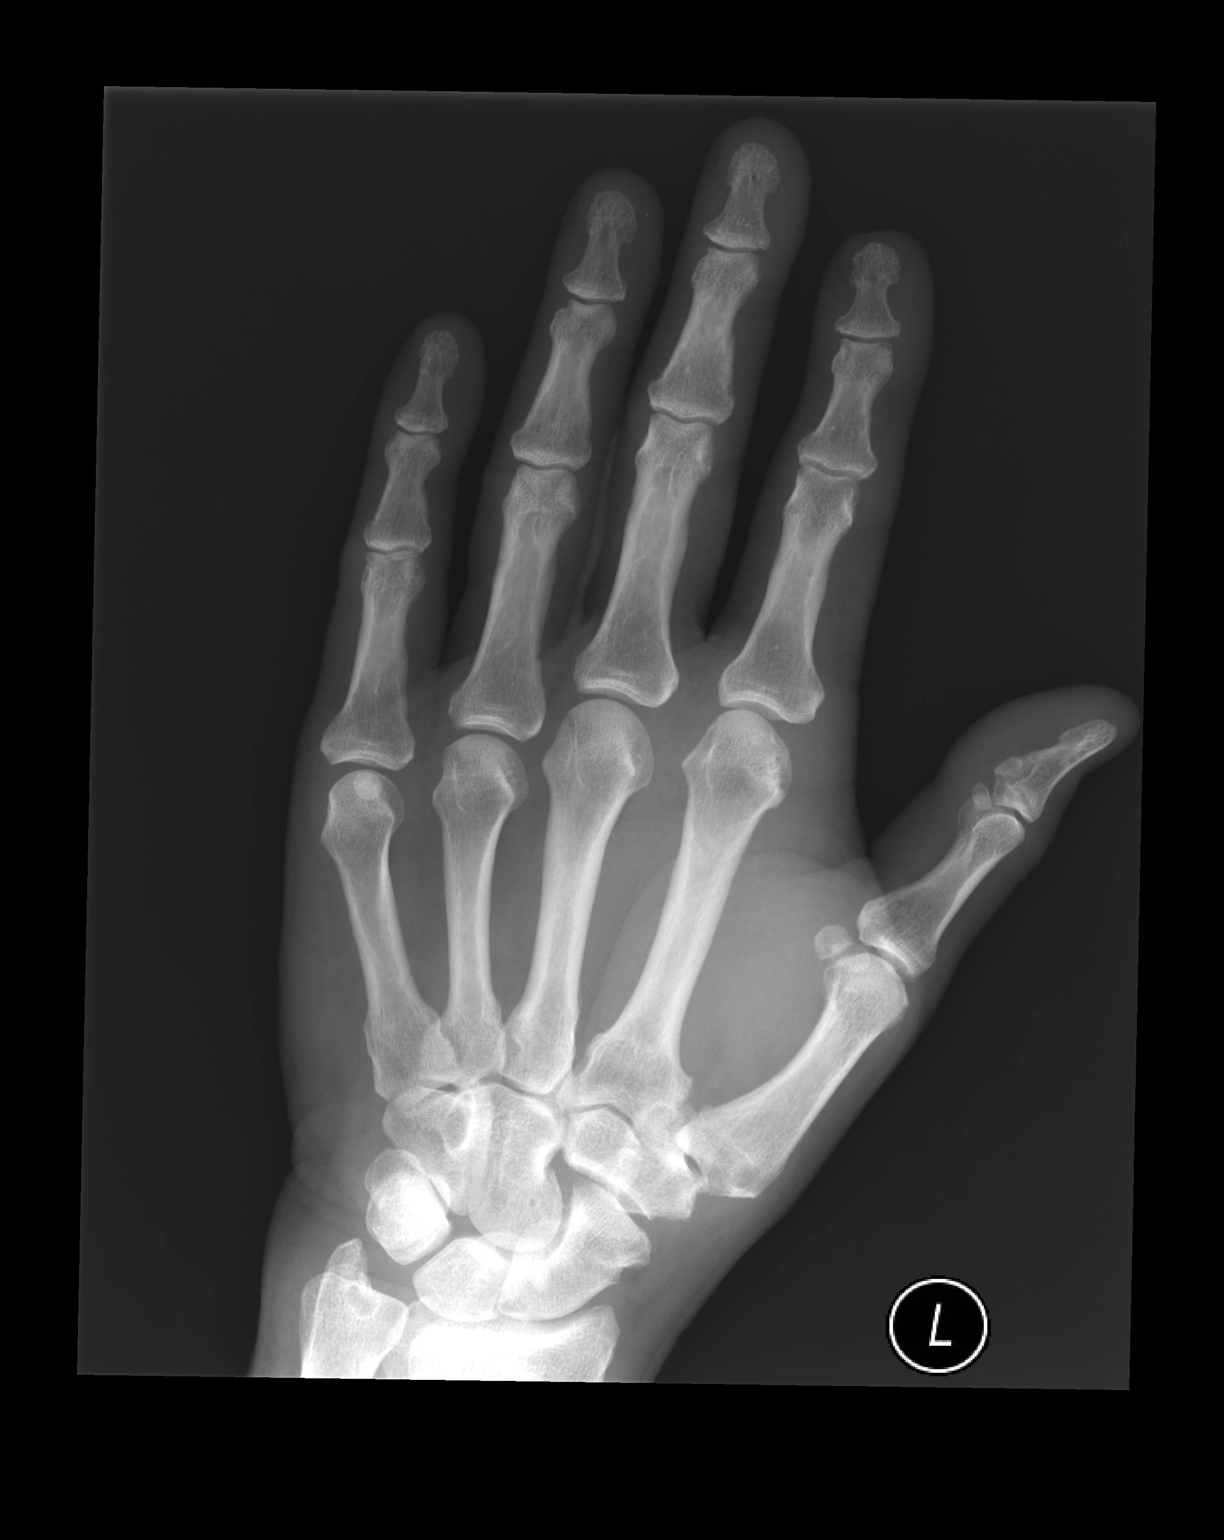

[lateral (1 of 2)]
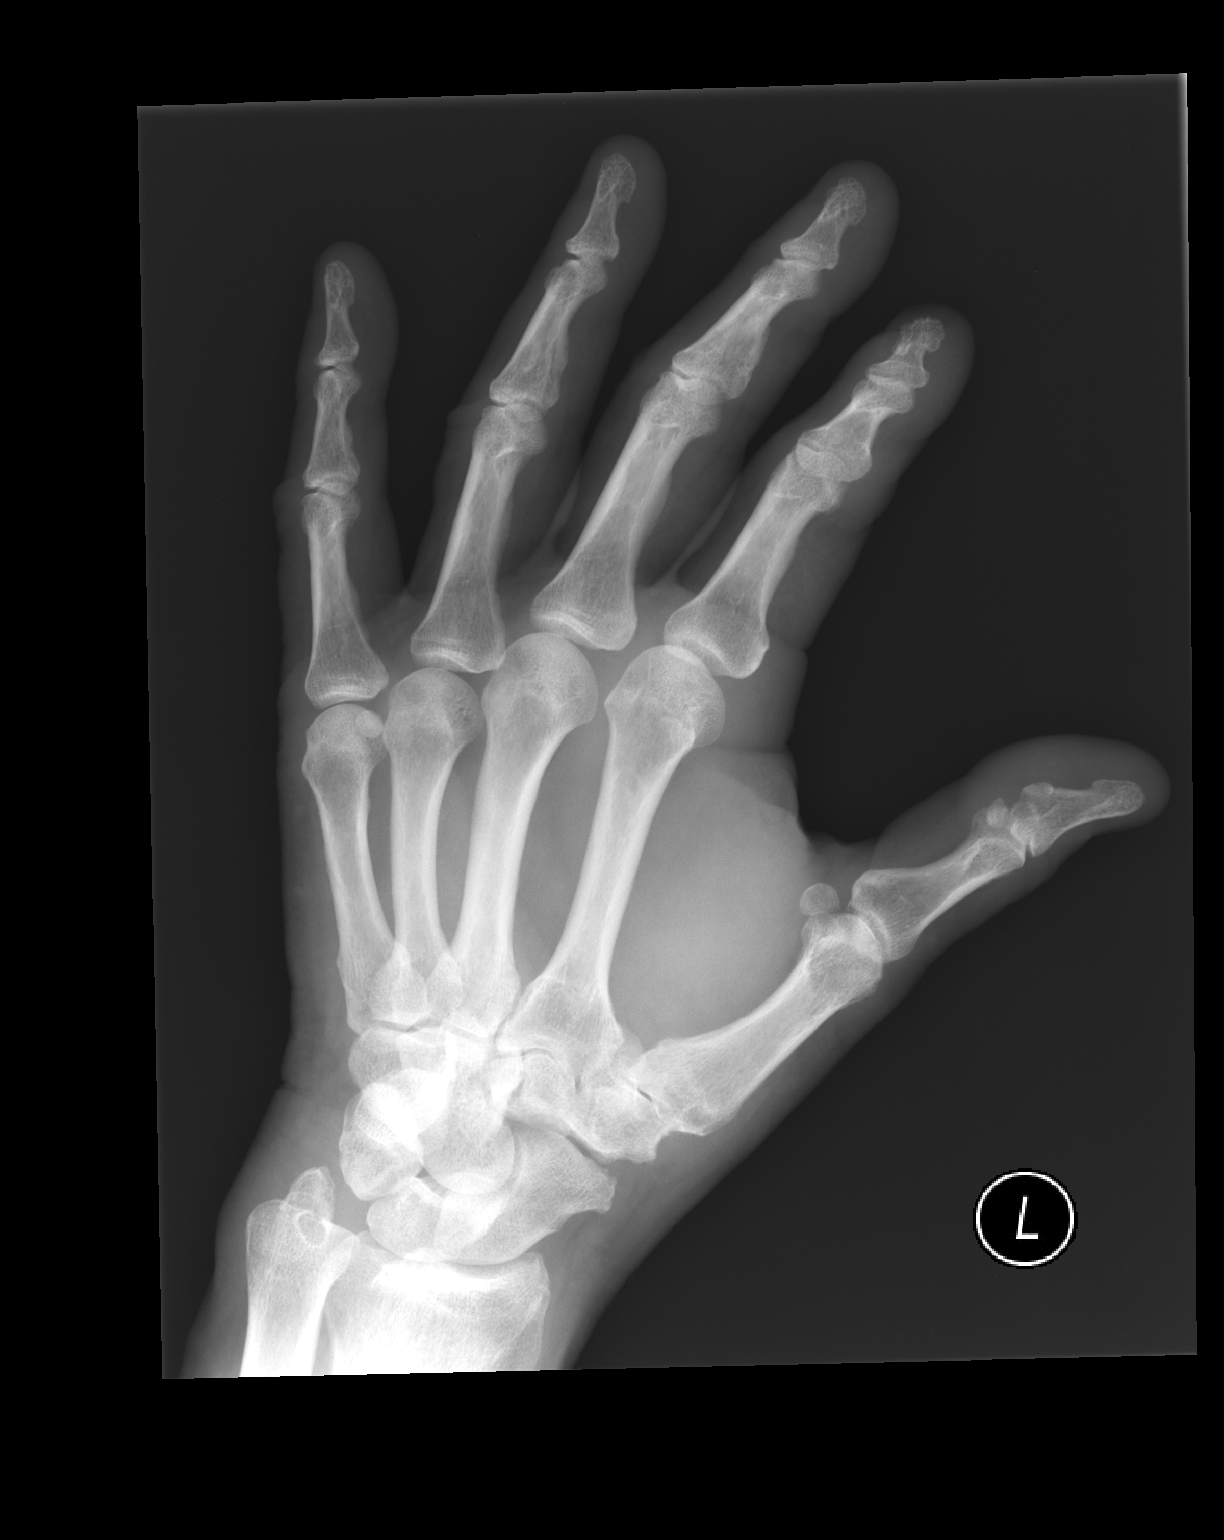

[lateral (2 of 2)]
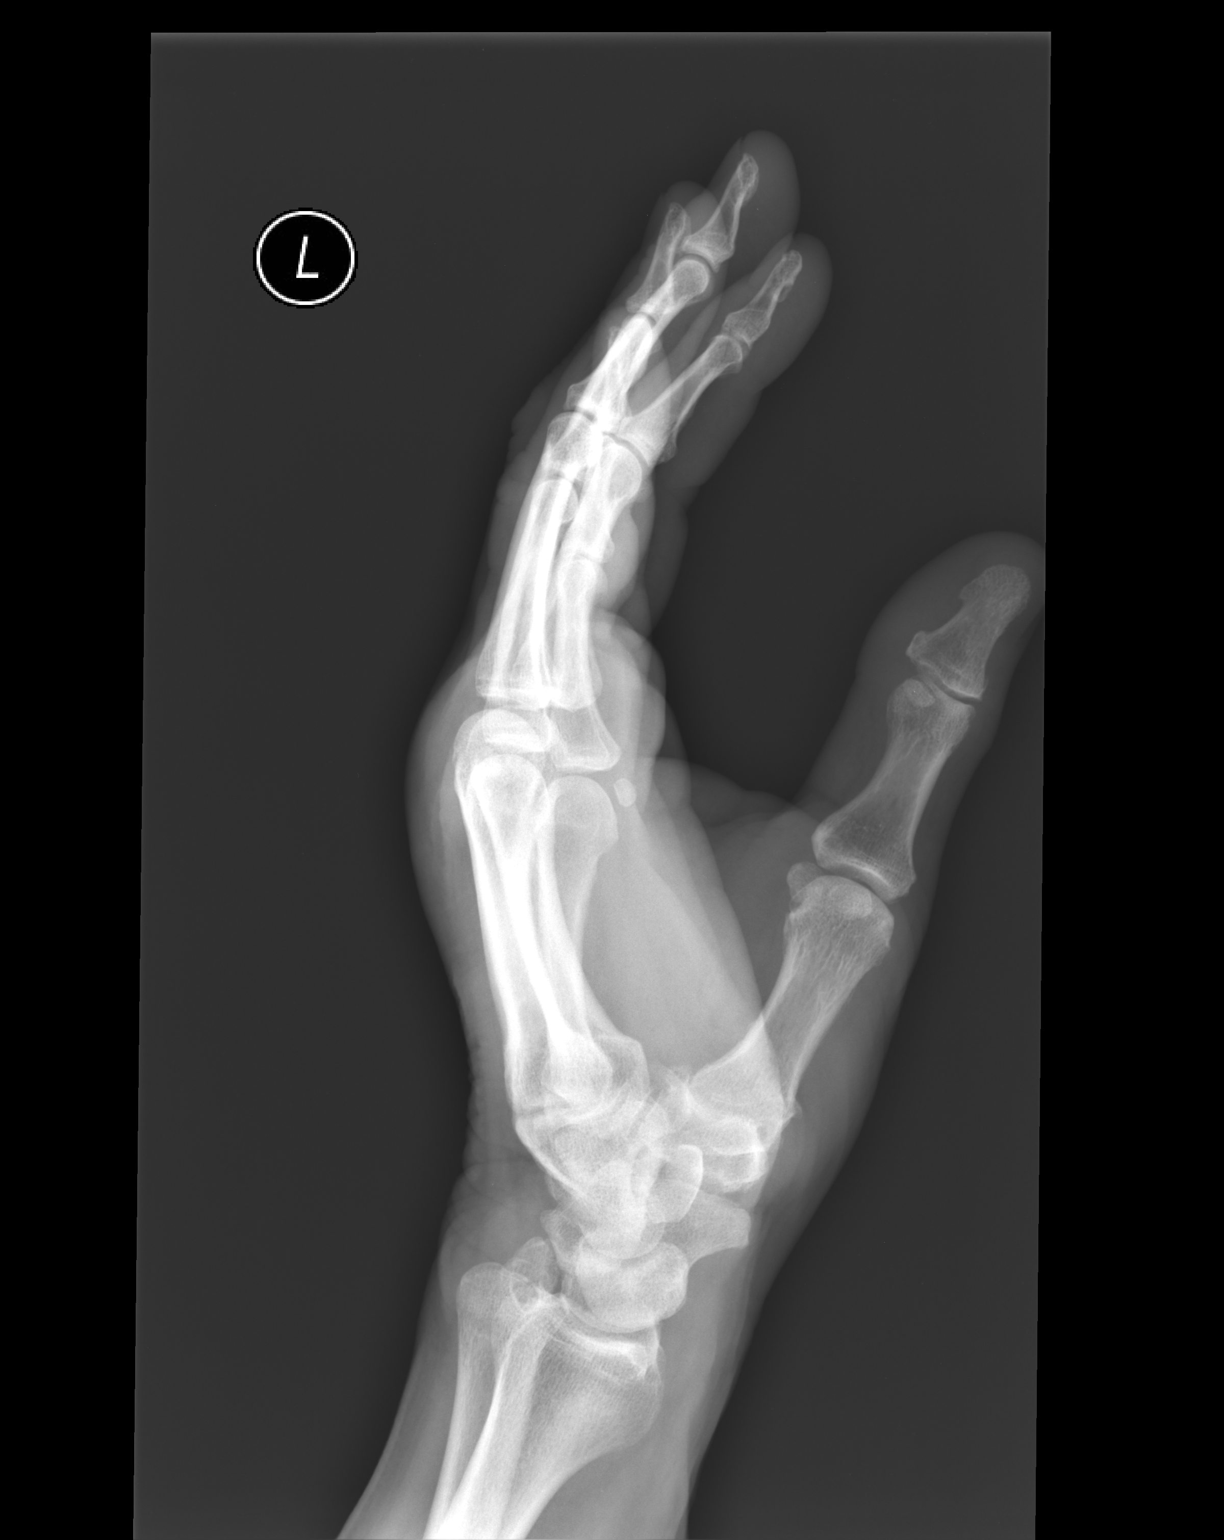

[3 of 3 positions shown; findings below may reference images not displayed]

FINDINGS: Three views of the left hand submitted.  No acute
fracture or subluxation.  Mild degenerative changes first carpal
metacarpal joint.  Mild narrowing of radiocarpal joint space.  Mild
soft tissue swelling dorsal metacarpal region.
IMPRESSION: No acute fracture or subluxation.  Mild degenerative changes first
carpal metacarpal joint.  Mild narrowing of radiocarpal joint
space.  Mild soft tissue swelling dorsal metacarpal region.

Clinically significant discrepancy from primary report, if
provided: None

## 2015-03-06 ENCOUNTER — Ambulatory Visit: Payer: Medicare Other | Admitting: Internal Medicine

## 2016-12-31 IMAGING — CR DG FINGER INDEX 2+V*L*
1 series · 1 of 1 positions shown · non-contrast
Comparison: None.

CLINICAL DATA: Splinter to index finger yesterday.

EXAM:
LEFT INDEX FINGER 2+V

[PA]
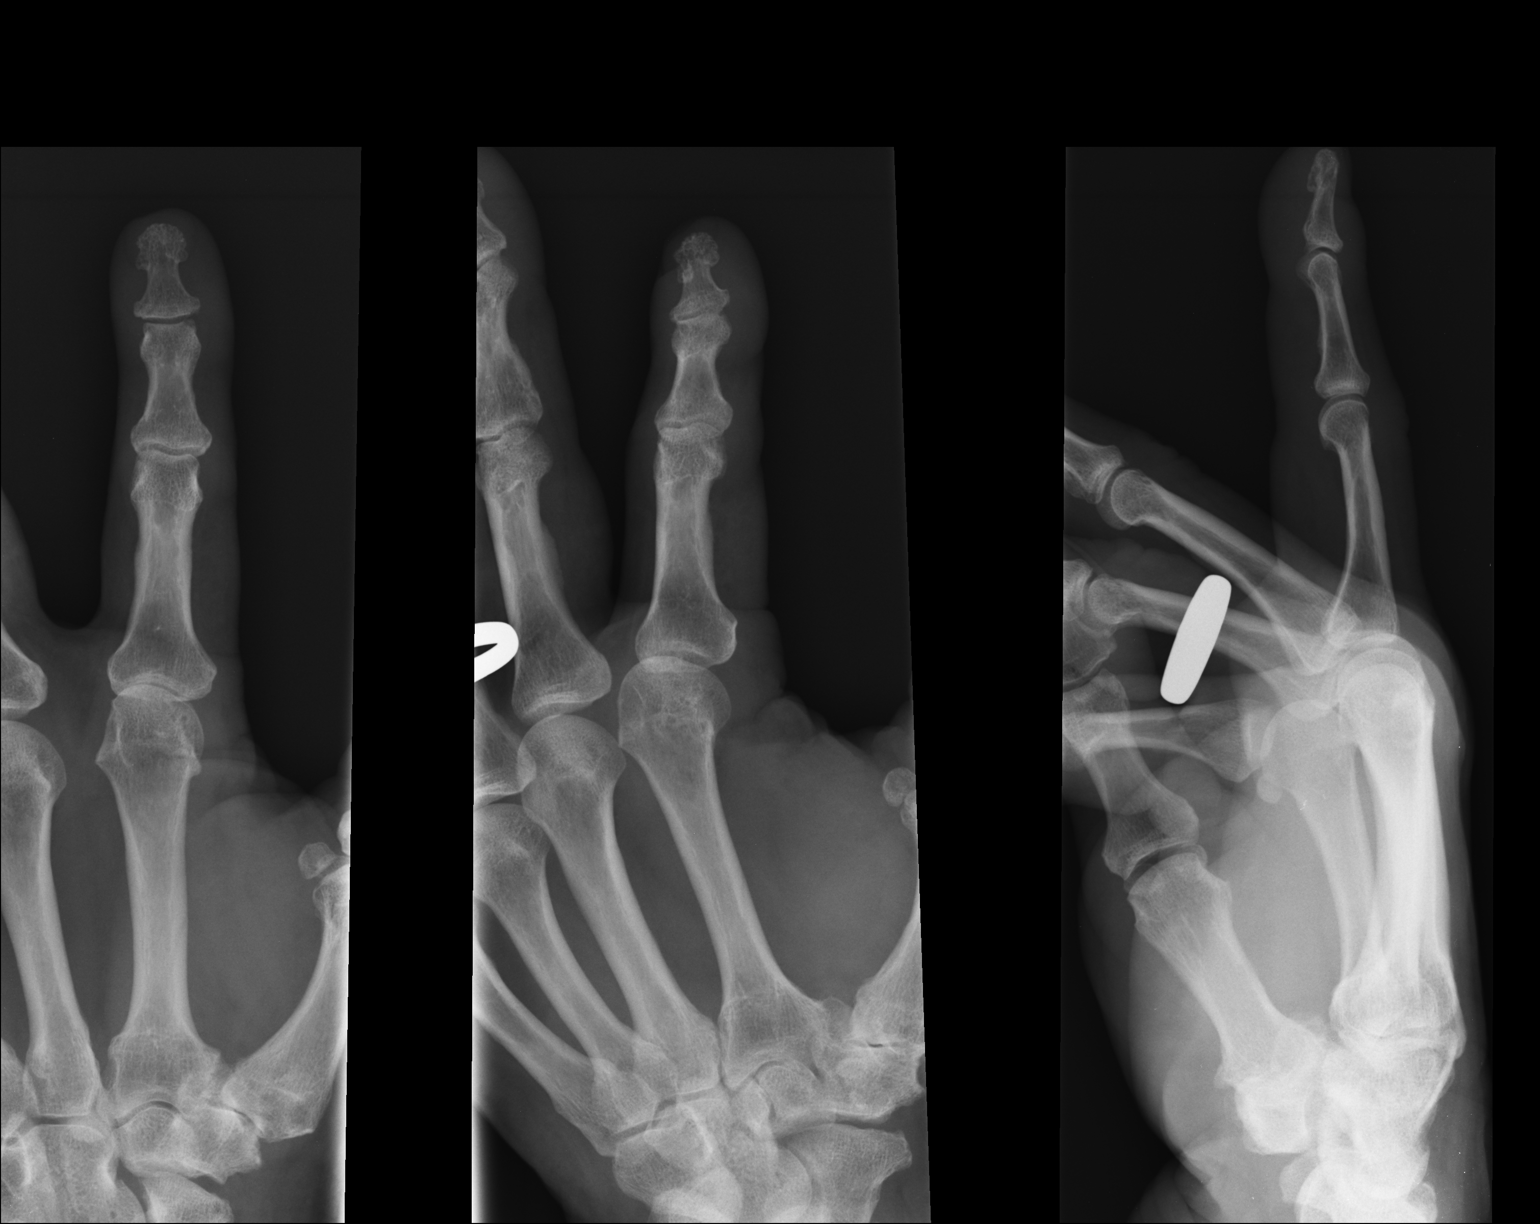

[1 of 1 positions shown; findings below may reference images not displayed]

FINDINGS: No evidence of fracture, dislocation or foreign body. Mild
degenerative changes over the first carpometacarpal joint.
IMPRESSION: No acute findings.
